# Patient Record
Sex: Male | Born: 1990 | Race: White | Hispanic: No | Marital: Single | Smoking: Never smoker
Health system: Southern US, Community
[De-identification: ages and names within clinical notes are randomized; demographics above are authoritative.]

## PROBLEM LIST (undated history)

## (undated) DIAGNOSIS — F329 Major depressive disorder, single episode, unspecified: Secondary | ICD-10-CM

## (undated) DIAGNOSIS — F419 Anxiety disorder, unspecified: Secondary | ICD-10-CM

## (undated) DIAGNOSIS — F32A Depression, unspecified: Secondary | ICD-10-CM

---
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
## 2015-07-26 ENCOUNTER — Emergency Department (HOSPITAL_COMMUNITY)
Admission: EM | Admit: 2015-07-26 | Discharge: 2015-07-26 | Disposition: A | Discharge status: Home / Self Care | Payer: BLUE CROSS/BLUE SHIELD | Attending: Emergency Medicine | Admitting: Emergency Medicine

## 2015-07-26 ENCOUNTER — Encounter (HOSPITAL_COMMUNITY): Payer: Self-pay

## 2015-07-26 DIAGNOSIS — F329 Major depressive disorder, single episode, unspecified: Secondary | ICD-10-CM | POA: Diagnosis not present

## 2015-07-26 DIAGNOSIS — R45851 Suicidal ideations: Secondary | ICD-10-CM

## 2015-07-26 DIAGNOSIS — F32A Depression, unspecified: Secondary | ICD-10-CM

## 2015-07-26 HISTORY — DX: Depression, unspecified: F32.A

## 2015-07-26 HISTORY — DX: Anxiety disorder, unspecified: F41.9

## 2015-07-26 HISTORY — DX: Major depressive disorder, single episode, unspecified: F32.9

## 2015-07-26 LAB — CBC WITH DIFFERENTIAL/PLATELET
Basophils Absolute: 0.1 10*3/uL (ref 0.0–0.1)
Basophils Relative: 2 %
Eosinophils Absolute: 0.1 10*3/uL (ref 0.0–0.7)
Eosinophils Relative: 2 %
HCT: 46.6 % (ref 39.0–52.0)
Hemoglobin: 16.9 g/dL (ref 13.0–17.0)
Lymphocytes Relative: 42 %
Lymphs Abs: 2.4 10*3/uL (ref 0.7–4.0)
MCH: 31.8 pg (ref 26.0–34.0)
MCHC: 36.3 g/dL — ABNORMAL HIGH (ref 30.0–36.0)
MCV: 87.6 fL (ref 78.0–100.0)
Monocytes Absolute: 0.6 10*3/uL (ref 0.1–1.0)
Monocytes Relative: 10 %
Neutro Abs: 2.5 10*3/uL (ref 1.7–7.7)
Neutrophils Relative %: 44 %
Platelets: 228 10*3/uL (ref 150–400)
RBC: 5.32 MIL/uL (ref 4.22–5.81)
RDW: 12.7 % (ref 11.5–15.5)
WBC: 5.7 10*3/uL (ref 4.0–10.5)

## 2015-07-26 LAB — BASIC METABOLIC PANEL
Anion gap: 6 (ref 5–15)
BUN: 21 mg/dL — ABNORMAL HIGH (ref 6–20)
CO2: 30 mmol/L (ref 22–32)
Calcium: 9.4 mg/dL (ref 8.9–10.3)
Chloride: 103 mmol/L (ref 101–111)
Creatinine, Ser: 1.21 mg/dL (ref 0.61–1.24)
GFR calc Af Amer: >60 mL/min (ref >60)
GFR calc non Af Amer: >60 mL/min (ref >60)
Glucose, Bld: 96 mg/dL (ref 65–99)
Potassium: 4.2 mmol/L (ref 3.5–5.1)
Sodium: 139 mmol/L (ref 135–145)

## 2015-07-26 LAB — RAPID URINE DRUG SCREEN, HOSP PERFORMED
Amphetamines: NOT DETECTED
Barbiturates: NOT DETECTED
Benzodiazepines: NOT DETECTED
Cocaine: NOT DETECTED
Opiates: NOT DETECTED
Tetrahydrocannabinol: NOT DETECTED

## 2015-07-26 LAB — ETHANOL: Alcohol, Ethyl (B): <5 mg/dL (ref <5)

## 2015-07-26 MED ORDER — ACETAMINOPHEN 325 MG PO TABS: 650.0000 mg | ORAL_TABLET | ORAL | Status: DC | PRN

## 2015-07-26 MED ORDER — ALUM & MAG HYDROXIDE-SIMETH 200-200-20 MG/5ML PO SUSP: 30.0000 mL | ORAL | Status: DC | PRN

## 2015-07-26 MED ORDER — ONDANSETRON HCL 4 MG PO TABS: 4.0000 mg | ORAL_TABLET | Freq: Three times a day (TID) | ORAL | Status: DC | PRN

## 2015-07-26 MED ORDER — IBUPROFEN 200 MG PO TABS
600.0000 mg | ORAL_TABLET | Freq: Three times a day (TID) | ORAL | Status: DC | PRN
Start: 2015-07-26 — End: 2015-07-27

## 2015-07-26 MED ORDER — ZOLPIDEM TARTRATE 5 MG PO TABS: 5.0000 mg | ORAL_TABLET | Freq: Every evening | ORAL | Status: DC | PRN

## 2015-07-26 NOTE — ED Notes (Signed)
States he has some "triggers" going on.  Denies SI/HI. o physical complaints and no home medications. Support offered and POC explained.

## 2015-07-26 NOTE — ED Notes (Signed)
Pt states feeling depressed.  Told by nurse at work that he may need to come in to be evaluated.  Pt has had depression for about a week.  Denies SI/HI but did have thoughts of hurting self on Sunday.  Does not take meds.  Attempting to get back with counselor but cannot get appt as of yet.  Possible late June.

## 2015-07-26 NOTE — ED Notes (Signed)
Pt passed out mid blood draw.  Cool rag applied to head and RN notified for assistance.

## 2015-07-26 NOTE — ED Notes (Signed)
Patient denies SI, HI, AVH. Denies pain. No acute signs of distress noted. Contracts for safety. Patient sister Berline Choughmily Bozich aware that patient is discharging.

## 2015-07-26 NOTE — ED Notes (Signed)
Bed: [facility] Expected date:  Expected time:  Means of arrival:  Comments: Hold for triage 3 

## 2015-07-26 NOTE — ED Provider Notes (Signed)
This is a 25 year old male who presented to the emergency department for his depression.  I was asked to come and evaluate the patient after his second evaluation by TTS. Patient was denying any suicidal or homicidal ideation. He did report increased depression and states over the weekend he had some vague thoughts of suicide without any plan or intention. 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 He does not feel that he needs to stay overnight for inpatient help at this time. His parents are away although he lives at home with them. They've the TTS evaluator had felt that the patient could go home after his second evaluation of the patient. [facility] and says that he can make arrangements to follow-up there. Patient was given strict return precautions. The nurse discuss with the patient's sister that he would need to stay with somebody for safety this evening. Patient to stay with his sister overnight. Patient contracted for safety with TTS and also signed a contract for safety before leaving the department. Patient with no history of previous suicide attempts. He was discharged in stable condition with strict return precautions.  Leta BaptistEmily Roe Aries Townley, MD 07/27/15 [phone]

## 2015-07-26 NOTE — ED Notes (Signed)
James Morrow [phone]  [name] has stated to Clinical research associatewriter that patient can stay with her.

## 2015-07-26 NOTE — Discharge Instructions (Signed)
Suicidal Feelings: How to Help Yourself  Suicide is the taking of one's own life. If you feel as though life is getting too tough to handle and are thinking about suicide, get help right away. To get help:  · Call your local emergency services (911 in the [place]  · Call a suicide hotline to speak with a trained counselor who understands how you are feeling. The following is a list of suicide hotlines in the [place]. For a list of hotlines in [place], visit www.suicide.org/hotlines/international/[place]-suicide-hotlines.html.  ·  1-800-273-TALK (1-800-273-8255).  ·  1-800-SUICIDE (1-800-784-2433).  ·  1-888-628-9454. This is a hotline for Spanish speakers.  ·  1-800-799-4TTY (1-800-799-4889). This is a hotline for TTY users.  ·  1-866-4-U-TREVOR (1-866-488-7386). This is a hotline for lesbian, gay, bisexual, transgender, or questioning youth.  · Contact a crisis center or a local suicide prevention center. To find a crisis center or suicide prevention center:  · Call your local hospital, clinic, community service organization, mental health center, social service provider, or health department. Ask for assistance in connecting to a crisis center.  · Visit www.suicidepreventionlifeline.org/getinvolved/locator for a list of crisis centers in the [place], or visit www.suicideprevention.[place]/thinking-about-suicide/find-a-crisis-centre for a list of centers in [place].  · Visit the following websites:  ·  National Suicide Prevention Lifeline: www.suicidepreventionlifeline.org  ·  Hopeline: www.hopeline.com  ·  American Foundation for Suicide Prevention: www.afsp.org  ·  The Trevor Project (for lesbian, gay, bisexual, transgender, or questioning youth): www.thetrevorproject.org  HOW CAN I HELP MYSELF FEEL BETTER?  · Promise yourself that you will not do anything drastic when you have suicidal feelings. Remember, there is hope. Many people have gotten through suicidal thoughts and feelings, and you will, too. You may  have gotten through them before, and this proves that you can get through them again.  · Let family, friends, teachers, or counselors know how you are feeling. Try not to isolate yourself from those who care about you. Remember, they will want to help you. Talk with someone every day, even if you do not feel sociable. Face-to-face conversation is best.  · Call a mental health professional and see one regularly.  · Visit your primary health care provider every year.  · Eat a well-balanced diet, and space your meals so you eat regularly.  · Get plenty of rest.  · Avoid alcohol and drugs, and remove them from your home. They will only make you feel worse.  · If you are thinking of taking a lot of medicine, give your medicine to someone who can give it to you one day at a time. If you are on antidepressants and are concerned you will overdose, let your health care provider know so he or she can give you safer medicines. Ask your mental health professional about the possible side effects of any medicines you are taking.  · Remove weapons, poisons, knives, and anything else that could harm you from your home.  · Try to stick to routines. Follow a schedule every day. Put self-care on your schedule.  · Make a list of realistic goals, and cross them off when you achieve them. Accomplishments give a sense of worth.  · Wait until you are feeling better before doing the things you find difficult or unpleasant.  · Exercise if you are able. You will feel better if you exercise for even a half hour each day.  · Go out in the sun or into nature. This will help you recover from depression faster. If you have a favorite place to   walk, go there.  · Do the things that have always given you pleasure. Play your favorite music, read a good book, paint a picture, play your favorite instrument, or do anything else that takes your mind off your depression if it is safe to do.  · Keep your living space well lit.  · When you are feeling well,  write yourself a letter about tips and support that you can read when you are not feeling well.  · Remember that life's difficulties can be sorted out with help. Conditions can be treated. You can work on thoughts and strategies that serve you well.     This information is not intended to replace advice given to you by your health care provider. Make sure you discuss any questions you have with your health care provider.     Document Released: 08/19/2002 Document Revised: 03/05/2014 Document Reviewed: 06/09/2013  Elsevier Interactive Patient Education ©2016 Else[employer].  Major Depressive Disorder  Major depressive disorder is a mental illness. It also may be called clinical depression or unipolar depression. Major depressive disorder usually causes feelings of sadness, hopelessness, or helplessness. Some people with this disorder do not feel particularly sad but lose interest in doing things they used to enjoy (anhedonia). Major depressive disorder also can cause physical symptoms. It can interfere with work, school, relationships, and other normal everyday activities. The disorder varies in severity but is longer lasting and more serious than the sadness we all feel from time to time in our lives.  Major depressive disorder often is triggered by stressful life events or major life changes. Examples of these triggers include divorce, loss of your job or home, a move, and the death of a family member or close friend. Sometimes this disorder occurs for no obvious reason at all. People who have family members with major depressive disorder or bipolar disorder are at higher risk for developing this disorder, with or without life stressors. Major depressive disorder can occur at any age. It may occur just once in your life (single episode major depressive disorder). It may occur multiple times (recurrent major depressive disorder).  SYMPTOMS  People with major depressive disorder have either anhedonia or depressed mood  on nearly a daily basis for at least 2 weeks or longer. Symptoms of depressed mood include:  · Feelings of sadness (blue or down in the dumps) or emptiness.  · Feelings of hopelessness or helplessness.  · Tearfulness or episodes of crying (may be observed by others).  · Irritability (children and adolescents).  In addition to depressed mood or anhedonia or both, people with this disorder have at least four of the following symptoms:  · Difficulty sleeping or sleeping too much.    · Significant change (increase or decrease) in appetite or weight.    · Lack of energy or motivation.  · Feelings of guilt and worthlessness.    · Difficulty concentrating, remembering, or making decisions.  · Unusually slow movement (psychomotor retardation) or restlessness (as observed by others).    · Recurrent wishes for death, recurrent thoughts of self-harm (suicide), or a suicide attempt.  People with major depressive disorder commonly have persistent negative thoughts about themselves, other people, and the world. People with severe major depressive disorder may experience distorted beliefs or perceptions about the world (psychotic delusions). They also may see or hear things that are not real (psychotic hallucinations).  DIAGNOSIS  Major depressive disorder is diagnosed through an assessment by your health care provider. Your health care provider will ask about aspects of   your daily life, such as mood, sleep, and appetite, to see if you have the diagnostic symptoms of major depressive disorder. Your health care provider may ask about your medical history and use of alcohol or drugs, including prescription medicines. Your health care provider also may do a physical exam and blood work. This is because certain medical conditions and the use of certain substances can cause major depressive disorder-like symptoms (secondary depression). Your health care provider also may refer you to a mental health specialist for further evaluation  and treatment.  TREATMENT  It is important to recognize the symptoms of major depressive disorder and seek treatment. The following treatments can be prescribed for this disorder:    · Medicine. Antidepressant medicines usually are prescribed. Antidepressant medicines are thought to correct chemical imbalances in the brain that are commonly associated with major depressive disorder. Other types of medicine may be added if the symptoms do not respond to antidepressant medicines alone or if psychotic delusions or hallucinations occur.  · Talk therapy. Talk therapy can be helpful in treating major depressive disorder by providing support, education, and guidance. Certain types of talk therapy also can help with negative thinking (cognitive behavioral therapy) and with relationship issues that trigger this disorder (interpersonal therapy).  A mental health specialist can help determine which treatment is best for you. Most people with major depressive disorder do well with a combination of medicine and talk therapy. Treatments involving electrical stimulation of the brain can be used in situations with extremely severe symptoms or when medicine and talk therapy do not work over time. These treatments include electroconvulsive therapy, transcranial magnetic stimulation, and vagal nerve stimulation.     This information is not intended to replace advice given to you by your health care provider. Make sure you discuss any questions you have with your health care provider.     Document Released: 06/09/2012 Document Revised: 03/05/2014 Document Reviewed: 06/09/2012  Elsevier Interactive Patient Education ©2016 Elsevier Inc.

## 2015-07-26 NOTE — ED Provider Notes (Signed)
CSN: [id number]     Arrival date & time 07/26/15  1413 History   First MD Initiated Contact with Patient 07/26/15 1444     Chief Complaint  Patient presents with  . Depression     (Consider location/radiation/quality/duration/timing/severity/associated sxs/prior Treatment) HPI Patient presents to the emergency department withWorsening depression over the last several weeks.  He is also been having episodes suicidal ideation.  The patient states he sat in his house all weekend crying and depressed and was contemplating suicide.  The patient states that he has never attempted suicide in the past.  He feels like his depression is getting worse.  He has had some recent issues that have made this exacerbated. The patient denies chest pain, shortness of breath, headache,blurred vision, neck pain, fever, cough, weakness, numbness, dizziness, anorexia, edema, abdominal pain, nausea, vomiting, diarrhea, rash, back pain, dysuria, hematemesis, bloody stool, near syncope, or syncope. Past Medical History  Diagnosis Date  . Depression   . Anxiety disorder    History reviewed. No pertinent past surgical history. History reviewed. No pertinent family history. Social History  Substance Use Topics  . Smoking status: Never Smoker   . Smokeless tobacco: None  . Alcohol Use: Yes     Comment: social    Review of Systems   All other systems negative except as documented in the HPI. All pertinent positives and negatives as reviewed in the HPI. Allergies  Review of patient's allergies indicates no known allergies.  Home Medications   Prior to Admission medications   Not on File   BP 126/77 mmHg  Pulse 58  Temp(Src) 97.7 F (36.5 C) (Oral)  Resp 16  SpO2 100% Physical Exam  Constitutional: He is oriented to person, place, and time. He appears well-developed and well-nourished. No distress.  HENT:  Head: Normocephalic and atraumatic.  Mouth/Throat: Oropharynx is clear and moist.  Eyes: Pupils  are equal, round, and reactive to light.  Neck: Normal range of motion. Neck supple.  Cardiovascular: Normal rate, regular rhythm and normal heart sounds.  Exam reveals no gallop and no friction rub.   No murmur heard. Pulmonary/Chest: Effort normal and breath sounds normal. No respiratory distress. He has no wheezes.  Abdominal: Soft. Bowel sounds are normal. He exhibits no distension. There is no tenderness.  Neurological: He is alert and oriented to person, place, and time. He exhibits normal muscle tone. Coordination normal.  Skin: Skin is warm and dry. No rash noted. No erythema.  Psychiatric: His speech is normal and behavior is normal. Thought content is not paranoid. He exhibits a depressed mood. He expresses suicidal ideation. He expresses no homicidal ideation. He expresses no suicidal plans and no homicidal plans.  Nursing note and vitals reviewed.   ED Course  Procedures (including critical care time) Labs Review Labs Reviewed  BASIC METABOLIC PANEL - Abnormal; Notable for the following:    BUN 21 (*)    All other components within normal limits  CBC WITH DIFFERENTIAL/PLATELET - Abnormal; Notable for the following:    MCHC 36.3 (*)    All other components within normal limits  URINE RAPID DRUG SCREEN, HOSP PERFORMED  ETHANOL    Imaging Review No results found. I have personally reviewed and evaluated these images and lab results as part of my medical decision-making.   EKG Interpretation None      MDM   Final diagnoses:  None    Patient will need TTS assessment for suicidal ideation and increasing depression  Charlestine Nighthristopher Anthon Harpole, PA-C  07/26/15 2128  Arby Barrette, MD 08/04/15 [phone]

## 2015-07-26 NOTE — ED Notes (Signed)
Patient reports that he has not been to [facility] in approximately one year. States that he did call and leave a message with them today. States he is willing to get outpatient help.   Patient has signed ROI form to speak with his sister Berline Choughmily Halls. [facility]

## 2015-07-26 NOTE — BH Assessment (Signed)
[facility] Assessment Progress Note This Clinical research associatewriter spoke to patient after admission with patient stating he feels he is ready to be discharged. Patient states since he has been hydrated he is feeling, "much better." Patient presents brighter in his affect and states he will follow through with [facility] in the a.m. to investigate treatment options to include medication management. Patient denies any S/I or H/I and can contract for safety. Case was staffed with Shaune PollackLord DNP who agreed patient could be discharged.

## 2015-07-26 NOTE — ED Notes (Signed)
Patient appears mildly anxious, cooperative. Denies SI, HI, AVH.   Patient verbally contracts for safety at home.   Encouragement offered. Patient denies complaint.  Q15 safety checks continue.

## 2015-07-26 NOTE — ED Notes (Signed)
David with TTS states he has reassessed patient and has spoken with psych PA, and they agree patient can go home. Onalee HuaDavid reports he will speak with EDP regarding discharge.

## 2015-07-26 NOTE — BH Assessment (Addendum)
Assessment Note  James Morrow N Schank is an 25 y.o. male that presents this date after having feelings that were very "overwhelming" on Saturday 07/23/15. Patient reports that he resides with his parents (who [place] in [place] and states he had some passive S/I over the weekend after what he explained as having an anxiety attack. Patient stated he started feeling "tense" on Saturday 07/23/15 and felt he "could not move" reporting ringing in his ears and felt dizzy. Patient stated he was so overwhelmed that he had some thoughts of S/I and then reported he felt very upset that he had those feelings. Patient denied any plan. Patient denies any S/I or prior inpatient admissions but did report some outpatient therapy in 2015. Patient stated that in 2015 he reported increased anxiety with depression and starting seeing a provider at [facility] (May MD) who prescribed Zoloft (patient unsure of dose) but reported that he discontinued the medication after six months. Patient stated he felt the medications were not working and felt he "worked through" his depression. Patient also reported seeing a [facility] for two sessions also but felt the medication was assisting him with his issues and discontinued his therapy. Patient denies any other outpatient treatment but did state during the assessment that he will go back to [facility] this week to investigate treatment options for his ongoing anxiety/depression. Patient could not identify any stressors and felt some of his issues "may be medical." Patient did feel faint during a blood draw prior to this writer assessing patient without incident although patient stated "he started hearing ringing in his ears again and feeling anxious." Patient presented with a very flat affect and was slow to interact with this Clinical research associatewriter. Patient denied any S/I or H/I and reported decreased sleep since 07/23/15. Patient could contract for safety but was open to being observed over night  since patient felt dizzy prior to the assessment. Patient felt he didn't require an inpatient admission but is open to medication interventions. Case was staffed with Shaune PollackLord DNP who recommended patient be observed over night and re-evaluated in the a.m.          Diagnosis: GAD  Past Medical History:  Past Medical History  Diagnosis Date  . Depression   . Anxiety disorder     History reviewed. No pertinent past surgical history.  Family History: History reviewed. No pertinent family history.  Social History:  reports that he has never smoked. He does not have any smokeless tobacco history on file. He reports that he drinks alcohol. He reports that he does not use illicit drugs.  Additional Social History:  Alcohol / Drug Use Pain Medications: See MAR Prescriptions: See MAR Over the Counter: See MAR History of alcohol / drug use?: No history of alcohol / drug abuse  CIWA: CIWA-Ar BP: 141/92 mmHg Pulse Rate: 72 COWS:    Allergies: No Known Allergies  Home Medications:  (Not in a hospital admission)  OB/GYN Status:  No LMP for male patient.  General Assessment Data Location of Assessment: [facility] TTS Assessment: In system Is this a Tele or Face-to-Face Assessment?: Face-to-Face Is this an Initial Assessment or a Re-assessment for this encounter?: Initial Assessment Marital status: Single Maiden name: na Is patient pregnant?: No Pregnancy Status: No Living Arrangements: Parent Can pt return to current living arrangement?: Yes Admission Status: Voluntary Is patient capable of signing voluntary admission?: Yes Referral Source: Self/Family/Friend Insurance type: BC/BS  Medical Screening Exam [facility] ONLY) Medical Exam completed: Yes  Crisis Care Plan Living Arrangements: Parent Legal Guardian: Other: (none) Name of Psychiatrist: na Name of Therapist: none currently  Education Status Is patient currently in school?: Yes Current Grade: na Highest grade of school  patient has completed: 12 Name of school: na Contact person: na  Risk to self with the past 6 months Suicidal Ideation: No Has patient been a risk to self within the past 6 months prior to admission? : No Suicidal Intent: No Has patient had any suicidal intent within the past 6 months prior to admission? : No Is patient at risk for suicide?: No Suicidal Plan?: No Has patient had any suicidal plan within the past 6 months prior to admission? : No Access to Means: No What has been your use of drugs/alcohol within the last 12 months?: Denies Previous Attempts/Gestures: No How many times?: 0 Other Self Harm Risks: None Triggers for Past Attempts: Other (Comment) (None noted) Intentional Self Injurious Behavior: None Family Suicide History: No Recent stressful life event(s): Other (Comment) (pt denies any symptoms) Persecutory voices/beliefs?: No Depression: Yes (In the past pt had some depression in 2015 not current) Depression Symptoms:  (None noted) Substance abuse history and/or treatment for substance abuse?: No Suicide prevention information given to non-admitted patients: Not applicable  Risk to Others within the past 6 months Homicidal Ideation: No Does patient have any lifetime risk of violence toward others beyond the six months prior to admission? : No Thoughts of Harm to Others: No Current Homicidal Intent: No Current Homicidal Plan: No Access to Homicidal Means: No Identified Victim: na History of harm to others?: No Assessment of Violence: None Noted Violent Behavior Description: none noted Does patient have access to weapons?: No Criminal Charges Pending?: No Does patient have a court date: No Is patient on probation?: No  Psychosis Hallucinations: None noted Delusions: None noted  Mental Status Report Appearance/Hygiene: Unremarkable Eye Contact: Fair Motor Activity: Freedom of movement Speech: Logical/coherent Level of Consciousness: Alert Mood:  Pleasant Affect: Appropriate to circumstance Anxiety Level: Minimal Thought Processes: Coherent, Relevant Judgement: Unimpaired Orientation: Person, Place, Time Obsessive Compulsive Thoughts/Behaviors: None  Cognitive Functioning Concentration: Normal Memory: Recent Intact, Remote Intact IQ: Average Insight: Good Impulse Control: Good Appetite: Good Weight Loss: 0 Weight Gain: 0 Sleep: Decreased Total Hours of Sleep: 5 Vegetative Symptoms: None  ADLScreening [facility] Assessment Services) Patient's cognitive ability adequate to safely complete daily activities?: Yes Patient able to express need for assistance with ADLs?: Yes Independently performs ADLs?: Yes (appropriate for developmental age)  Prior Inpatient Therapy Prior Inpatient Therapy: No Prior Therapy Dates: 2015 Prior Therapy Facilty/Provider(s): [facility] Reason for Treatment: Anxiety  Prior Outpatient Therapy Prior Outpatient Therapy: Yes Prior Therapy Dates: 2015 Prior Therapy Facilty/Provider(s): [facility] Reason for Treatment: Anxiety Does patient have an ACCT team?: No Does patient have Intensive In-House Services?  : No Does patient have [facility] services? : No Does patient have P4CC services?: No  ADL Screening (condition at time of admission) Patient's cognitive ability adequate to safely complete daily activities?: Yes Is the patient deaf or have difficulty hearing?: No Does the patient have difficulty seeing, even when wearing glasses/contacts?: No Does the patient have difficulty concentrating, remembering, or making decisions?: No Patient able to express need for assistance with ADLs?: Yes Does the patient have difficulty dressing or bathing?: No Independently performs ADLs?: Yes (appropriate for developmental age) Does the patient have difficulty walking or climbing stairs?: No Weakness of Legs: None Weakness of Arms/Hands: None  Home Assistive Devices/Equipment Home Assistive  Devices/Equipment: None  Therapy Consults (therapy consults require a physician order) PT Evaluation Needed: No OT Evalulation Needed: No SLP Evaluation Needed: No Abuse/Neglect Assessment (Assessment to be complete while patient is alone) Physical Abuse: Denies Verbal Abuse: Denies Sexual Abuse: Denies Exploitation of patient/patient's resources: Denies Self-Neglect: Denies Values / Beliefs Cultural Requests During Hospitalization: None Spiritual Requests During Hospitalization: None Consults Spiritual Care Consult Needed: No Social Work Consult Needed: No Merchant navy officer (For Healthcare) Does patient have an advance directive?: No Would patient like information on creating an advanced directive?: No - patient declined information (pt declines information)    Additional Information 1:1 In Past 12 Months?: No CIRT Risk: No Elopement Risk: No Does patient have medical clearance?: Yes     Disposition:  Case was staffed with Shaune Pollack DNP who recommended patient be observed over night and re-evaluated in the a.m.          Disposition Initial Assessment Completed for this Encounter: Yes Disposition of Patient: Referred to ([facility]) Patient referred to: Other (Comment) ([facility])  On Site Evaluation by:   Reviewed with Physician:    Alfredia Ferguson 07/26/2015 5:43 PM

## 2015-12-14 ENCOUNTER — Other Ambulatory Visit (INDEPENDENT_AMBULATORY_CARE_PROVIDER_SITE_OTHER): Payer: Self-pay | Admitting: Otolaryngology

## 2015-12-14 DIAGNOSIS — H9319 Tinnitus, unspecified ear: Secondary | ICD-10-CM

## 2015-12-14 DIAGNOSIS — H918X9 Other specified hearing loss, unspecified ear: Secondary | ICD-10-CM

## 2015-12-16 ENCOUNTER — Ambulatory Visit
Admission: RE | Admit: 2015-12-16 | Discharge: 2015-12-16 | Disposition: A | Discharge status: Home / Self Care | Payer: BLUE CROSS/BLUE SHIELD | Source: Ambulatory Visit | Attending: Otolaryngology | Admitting: Otolaryngology

## 2015-12-16 DIAGNOSIS — H918X9 Other specified hearing loss, unspecified ear: Secondary | ICD-10-CM

## 2015-12-16 DIAGNOSIS — H9319 Tinnitus, unspecified ear: Secondary | ICD-10-CM

## 2015-12-16 MED ORDER — GADOBENATE DIMEGLUMINE 529 MG/ML IV SOLN
13.0000 mL | Freq: Once | INTRAVENOUS | Status: AC | PRN
  Administered 2015-12-16: 13 mL via INTRAVENOUS

## 2017-05-24 ENCOUNTER — Ambulatory Visit
Admission: RE | Admit: 2017-05-24 | Discharge: 2017-05-24 | Disposition: A | Discharge status: Home / Self Care | Payer: BLUE CROSS/BLUE SHIELD | Source: Ambulatory Visit | Attending: Family Medicine | Admitting: Family Medicine

## 2017-05-24 ENCOUNTER — Other Ambulatory Visit: Payer: Self-pay | Admitting: Family Medicine

## 2017-05-24 DIAGNOSIS — M79671 Pain in right foot: Secondary | ICD-10-CM

## 2017-12-10 ENCOUNTER — Ambulatory Visit (INDEPENDENT_AMBULATORY_CARE_PROVIDER_SITE_OTHER): Payer: BLUE CROSS/BLUE SHIELD | Admitting: Psychiatry

## 2017-12-10 DIAGNOSIS — F411 Generalized anxiety disorder: Secondary | ICD-10-CM

## 2017-12-10 DIAGNOSIS — F4010 Social phobia, unspecified: Secondary | ICD-10-CM

## 2017-12-10 DIAGNOSIS — F332 Major depressive disorder, recurrent severe without psychotic features: Secondary | ICD-10-CM | POA: Insufficient documentation

## 2017-12-10 MED ORDER — ARIPIPRAZOLE 5 MG PO TABS: 5.0000 mg | ORAL_TABLET | Freq: Every day | ORAL | 1 refills | Status: DC

## 2017-12-10 MED ORDER — SERTRALINE HCL 100 MG PO TABS: 150.0000 mg | ORAL_TABLET | Freq: Every day | ORAL | 1 refills | Status: DC

## 2017-12-10 NOTE — Progress Notes (Signed)
[facility] Med Check  Patient ID: James Morrow,  MRN: [id number]  PCP: Ozella Rocks, MD  Date of Evaluation: 12/10/2017 Time spent:30 minutes   HISTORY/CURRENT STATUS: HPI CC dep with SI.  Dep x 2 mos severe and less severe longer.  SI  For a week.  Voiced at work.  Thought s cutting wrists.  Work may have made it worse.  Missed 2 days work/week lately. Poor performance.  No extreme stress. He wants help and not to die.  Commits to safety. Zoloft helped anxiety several years ago. Not much benefit this time.  Sleep 6-7 hours ok.  Normal. He provided written statement of his sx.  Prior psych meds: Wellbutrin NR, Buspirone NR.  Mirtazepine for sleep. Individual Medical History/ Review of Systems: Changes? :No  Allergies: Patient has no known allergies.  Current Medications:  Current Outpatient Medications:  .  sertraline (ZOLOFT) 100 MG tablet, Take 100 mg by mouth daily., Disp: , Rfl:   Started in July.  Medication Side Effects: None  Family Medical/ Social History: Changes? Yes FHx dep and anxiety in both parents.  Both on psych meds. Alcohol a beer/D.  No drugs. Not pot.  MENTAL HEALTH EXAM:  There were no vitals taken for this visit.There is no height or weight on file to calculate BMI.  General Appearance: Casual  Eye Contact:  Fair  Speech:  reduced quantity.  Chronic difficulty expressing himself verbally.  Volume:  Decreased  Mood:  Anxious, Depressed and Hopeless  Affect:  Constricted and Depressed  Thought Process:  Goal Directed  Orientation:  Full (Time, Place, and Person)  Thought Content: Logical   Suicidal Thoughts:  Yes.  without intent/plan  Homicidal Thoughts:  No  Memory:  Recent  Judgement:  Fair  Insight:  Fair  Psychomotor Activity:  Normal  Concentration:  Concentration: Good  Recall:  Good  Fund of Knowledge: Good  Language: Good  Akathisia:  No  AIMS (if indicated): not done  Assets:  Housing Physical  Health Transportation Vocational/Educational  ADL's:  Intact  Cognition: WNL  Prognosis:  Fair    DIAGNOSES:    ICD-10-CM   1. Severe episode of recurrent major depressive disorder, without psychotic features (HCC) F33.2   2. Generalized anxiety disorder F41.1   3. Social anxiety disorder F40.10     RECOMMENDATIONS:  Greater than 50% of face to face time with patient was spent on counseling and coordination of care. We discussed the severity of his depression with recent suicidal thoughts of cutting him self "vertically" with the goal of causing death.  He now states he no longer wants to die but wants help.  He does not know why his depression has worsened so severely has been there to a mild to moderate degree for some time but is escalated out of proportion to stressors. Discussed safety issues and he commits to safety. We discussed various treatment options.  He has had no side effects and a history of partial benefit from Zoloft so we will increase the dose to 150.  Because of the severity of symptoms and the need for escalating the recovery rate we will also add aripiprazole 5 mg daily.  We discussed the mechanism of action of this medicine in response to his questions about it.  This may possibly be a temporary augmentation strategy.  Discussed side effects including EPS type side effects.  We discussed the importance of attacking the depression as aggressively and as possible which includes psychotherapy.  He  is planning to start with a therapist rec from a friend who does CBT.  He commmits ot safety.  Call if gets worse.  He agrees to the plan and will call if he has any worsening symptoms. Lauraine Rinne, MD

## 2017-12-10 NOTE — Patient Instructions (Signed)
Start counseling and the meds ordered. Call if anything gets worse.

## 2018-01-10 ENCOUNTER — Ambulatory Visit (INDEPENDENT_AMBULATORY_CARE_PROVIDER_SITE_OTHER): Payer: BLUE CROSS/BLUE SHIELD | Admitting: Psychiatry

## 2018-01-10 ENCOUNTER — Encounter: Payer: Self-pay | Admitting: Psychiatry

## 2018-01-10 DIAGNOSIS — F322 Major depressive disorder, single episode, severe without psychotic features: Secondary | ICD-10-CM | POA: Diagnosis not present

## 2018-01-10 DIAGNOSIS — F4010 Social phobia, unspecified: Secondary | ICD-10-CM | POA: Diagnosis not present

## 2018-01-10 DIAGNOSIS — F411 Generalized anxiety disorder: Secondary | ICD-10-CM

## 2018-01-10 NOTE — Progress Notes (Signed)
James Morrow [id number] 01-27-1991 27 y.o.  Subjective:   Patient ID:  James Morrow is a 27 y.o. (DOB 1990/12/09) male.  Chief Complaint:  Chief Complaint  Patient presents with  . Depression  . Anxiety    HPI [name] presents to the office today for follow-up of severe depression and some chronic anxiety.  At last visit on 10/15 we increased the sertraline 10 150mg  and added aripiprazole at 5 mg daily.  The first week noticed improvement with the Abilify but lost the benefit quickly.  Back to where it was before.  Seems like tiredness and sleepiness is a SE starting 2 weeks or 10 days into treatment.  Pt reports that mood is Severe 10/10 Depressed, Hopeless and Worthless and describes anxiety as Moderate. Anxiety symptoms include: Excessive Worry,. Pt reports no sleep issues; 7 hours normal. Pt reports that appetite is good. Pt reports that energy is poor and anhedonia, loss of interest or pleasure in usual activities and poor motivation. Concentration is poor. Suicidal thoughts:  patient admits to thoughts but denies active plan or intent, agrees to no suicide contract and some intrusive images of suicide.  Goes to work but doesn't do much and leaves early.  Hard to function.  Can't function more than 1-2 hours.  Nurse at work, GP, and boss recommend partial disability of 4 hours daily until he recovers.  Review of Systems:  Review of Systems  Constitutional: Positive for fatigue.  Gastrointestinal: Positive for nausea.  Neurological: Negative for tremors and weakness.  Psychiatric/Behavioral: Positive for decreased concentration, dysphoric mood and suicidal ideas. Negative for agitation, behavioral problems, confusion, hallucinations, self-injury and sleep disturbance. The patient is nervous/anxious. The patient is not hyperactive.   Nausea predates med changes  Medications: I have reviewed the patient's current medications.  Current Outpatient Medications  Medication Sig  Dispense Refill  . ARIPiprazole (ABILIFY) 5 MG tablet Take 1 tablet (5 mg total) by mouth daily. 30 tablet 1  . sertraline (ZOLOFT) 100 MG tablet Take 1.5 tablets (150 mg total) by mouth daily. 45 tablet 1   No current facility-administered medications for this visit.     Medication Side Effects: Fatigue  Allergies: No Known Allergies  Past Medical History:  Diagnosis Date  . Anxiety disorder   . Depression     History reviewed. No pertinent family history.  Social History   Socioeconomic History  . Marital status: Single    Spouse name: Not on file  . Number of children: Not on file  . Years of education: Not on file  . Highest education level: Not on file  Occupational History  . Not on file  Social Needs  . Financial resource strain: Not on file  . Food insecurity:    Worry: Not on file    Inability: Not on file  . Transportation needs:    Medical: Not on file    Non-medical: Not on file  Tobacco Use  . Smoking status: Never Smoker  Substance and Sexual Activity  . Alcohol use: Yes    Comment: social  . Drug use: No  . Sexual activity: Not on file  Lifestyle  . Physical activity:    Days per week: Not on file    Minutes per session: Not on file  . Stress: Not on file  Relationships  . Social connections:    Talks on phone: Not on file    Gets together: Not on file    Attends religious service: Not  on file    Active member of club or organization: Not on file    Attends meetings of clubs or organizations: Not on file    Relationship status: Not on file  . Intimate partner violence:    Fear of current or ex partner: Not on file    Emotionally abused: Not on file    Physically abused: Not on file    Forced sexual activity: Not on file  Other Topics Concern  . Not on file  Social History Narrative  . Not on file    Past Medical History, Surgical history, Social history, and Family history were reviewed and updated as appropriate.   Please see  review of systems for further details on the patient's review from today.   Objective:   Physical Exam:  There were no vitals taken for this visit.  Physical Exam  Neurological: He displays no tremor. Gait normal.  Psychiatric: Judgment normal. His mood appears anxious. His affect is blunt. His speech is tangential. His speech is not rapid and/or pressured, not delayed and not slurred. He is slowed and withdrawn. He is not actively hallucinating. Thought content is not paranoid and not delusional. Cognition and memory are normal. He exhibits a depressed mood. He expresses suicidal ideation. He expresses no homicidal ideation. He expresses no suicidal plans.  Chronically blunted and hypoverbal Insight and judgment fair. He is attentive.    Lab Review:     Component Value Date/Time   NA 139 07/26/2015 1659   K 4.2 07/26/2015 1659   CL 103 07/26/2015 1659   CO2 30 07/26/2015 1659   GLUCOSE 96 07/26/2015 1659   BUN 21 (H) 07/26/2015 1659   CREATININE 1.21 07/26/2015 1659   CALCIUM 9.4 07/26/2015 1659   GFRNONAA >60 07/26/2015 1659   GFRAA >60 07/26/2015 1659       Component Value Date/Time   WBC 5.7 07/26/2015 1659   RBC 5.32 07/26/2015 1659   HGB 16.9 07/26/2015 1659   HCT 46.6 07/26/2015 1659   PLT 228 07/26/2015 1659   MCV 87.6 07/26/2015 1659   MCH 31.8 07/26/2015 1659   MCHC 36.3 (H) 07/26/2015 1659   RDW 12.7 07/26/2015 1659   LYMPHSABS 2.4 07/26/2015 1659   MONOABS 0.6 07/26/2015 1659   EOSABS 0.1 07/26/2015 1659   BASOSABS 0.1 07/26/2015 1659    No results found for: POCLITH, LITHIUM   No results found for: PHENYTOIN, PHENOBARB, VALPROATE, CBMZ   .res Assessment: Plan:    Severe major depression without psychotic features (HCC)  Social anxiety disorder  Generalized anxiety disorder  Also Asberger's Syndrome  Greater than 50% of face to face time with patient was spent on counseling and coordination of care. We discussed Explained the long t 1/2 life of  aripiprazole and how that explains the initial benefit then loss of it and emerging SE in detail.  This leads to other options. Rexulti 0.5mg  daily for 7 days then 1mg  daily.  DC Abilify now.  Discussed potential metabolic side effects associated with atypical antipsychotics, as well as potential risk for movement side effects. Advised pt to contact office if movement side effects occur.   Continue sertraline at 150.  Discussed safety plan at length with patient.  Advised patient to contact office with any worsening signs and symptoms.  Instructed patient to go to the [facility] emergency room for evaluation if experiencing any acute safety concerns, to include suicidal intent.  He commits to safety.  Starts counseling Tuesday, Vonna Kotyk.  Agree with partial disability 1/2 days until responds to treatment  This appt was 30 mins.  FU 3 weeks.  Meredith Staggersarey Cottle, MD, DFAPA    Please see After Visit Summary for patient specific instructions.  Future Appointments  Date Time Provider Department Center  01/31/2018  2:00 PM Cottle, Steva Readyarey G Jr., MD [facility] None    No orders of the defined types were placed in this encounter.     -------------------------------

## 2018-01-10 NOTE — Patient Instructions (Signed)
Stop aripiprazole.  Start Rexulti 05.mg daily for 1 week. If not better then increase to 1 mg daily. Call if any Side Effects.

## 2018-01-22 ENCOUNTER — Ambulatory Visit: Payer: BLUE CROSS/BLUE SHIELD | Admitting: Psychiatry

## 2018-01-31 ENCOUNTER — Encounter: Payer: Self-pay | Admitting: Psychiatry

## 2018-01-31 ENCOUNTER — Ambulatory Visit (INDEPENDENT_AMBULATORY_CARE_PROVIDER_SITE_OTHER): Payer: BLUE CROSS/BLUE SHIELD | Admitting: Psychiatry

## 2018-01-31 DIAGNOSIS — F322 Major depressive disorder, single episode, severe without psychotic features: Secondary | ICD-10-CM | POA: Diagnosis not present

## 2018-01-31 DIAGNOSIS — F332 Major depressive disorder, recurrent severe without psychotic features: Secondary | ICD-10-CM | POA: Diagnosis not present

## 2018-01-31 DIAGNOSIS — F4010 Social phobia, unspecified: Secondary | ICD-10-CM

## 2018-01-31 MED ORDER — SERTRALINE HCL 100 MG PO TABS: 200.0000 mg | ORAL_TABLET | Freq: Every day | ORAL | 1 refills | Status: DC

## 2018-01-31 MED ORDER — BREXPIPRAZOLE 1 MG PO TABS: 1.0000 mg | ORAL_TABLET | Freq: Every day | ORAL | 1 refills | Status: DC

## 2018-01-31 NOTE — Progress Notes (Signed)
[name] [id number] 1990-12-24 27 y.o.  Subjective:   Patient ID:  James Morrow is a 27 y.o. (DOB 1990-12-24) male.  Chief Complaint:  Chief Complaint  Patient presents with  . Follow-up    Med changes, Anxiety and depression.    HPI Lance Boschric N Liss presents to the office today for follow-up of depression.  Generally feels less down.  Rexulti better tolerated with less fatigue.  Pt reports that mood is Anxious and Depressed , depression still moderate to severe but better; and describes anxiety as moderate. Anxiety symptoms include: Excessive Worry,. Pt reports no sleep issues but lays in bed a couple of hours after awakening. Pt reports that appetite is decreased. Pt reports that energy is lethargic and down slightly and withdrawn from usual activities. Concentration is down slightly. Suicidal thoughts:  "once in a blue moon".  Has been working a couple of hours a day.  Conc is not sustainable.  Seen counselor Rene PaciNancy Gutman twice and has pending appt.  Review of Systems:  Review of Systems  Neurological: Negative for tremors and weakness.  Psychiatric/Behavioral: Positive for decreased concentration and dysphoric mood. Negative for agitation, behavioral problems, confusion, hallucinations, self-injury, sleep disturbance and suicidal ideas. The patient is nervous/anxious. The patient is not hyperactive.     Medications: I have reviewed the patient's current medications.  Current Outpatient Medications  Medication Sig Dispense Refill  . Brexpiprazole (REXULTI) 1 MG TABS Take 0.5 mg by mouth daily.    . sertraline (ZOLOFT) 100 MG tablet Take 1.5 tablets (150 mg total) by mouth daily. 45 tablet 1   No current facility-administered medications for this visit.     Medication Side Effects: None  Allergies: No Known Allergies  Past Medical History:  Diagnosis Date  . Anxiety disorder   . Depression     History reviewed. No pertinent family history.  Social History    Socioeconomic History  . Marital status: Single    Spouse name: Not on file  . Number of children: Not on file  . Years of education: Not on file  . Highest education level: Not on file  Occupational History  . Not on file  Social Needs  . Financial resource strain: Not on file  . Food insecurity:    Worry: Not on file    Inability: Not on file  . Transportation needs:    Medical: Not on file    Non-medical: Not on file  Tobacco Use  . Smoking status: Never Smoker  . Smokeless tobacco: Never Used  Substance and Sexual Activity  . Alcohol use: Yes    Comment: social  . Drug use: No  . Sexual activity: Not on file  Lifestyle  . Physical activity:    Days per week: Not on file    Minutes per session: Not on file  . Stress: Not on file  Relationships  . Social connections:    Talks on phone: Not on file    Gets together: Not on file    Attends religious service: Not on file    Active member of club or organization: Not on file    Attends meetings of clubs or organizations: Not on file    Relationship status: Not on file  . Intimate partner violence:    Fear of current or ex partner: Not on file    Emotionally abused: Not on file    Physically abused: Not on file    Forced sexual activity: Not on file  Other  Topics Concern  . Not on file  Social History Narrative  . Not on file    Past Medical History, Surgical history, Social history, and Family history were reviewed and updated as appropriate.   Please see review of systems for further details on the patient's review from today.   Objective:   Physical Exam:  There were no vitals taken for this visit.  Physical Exam  Constitutional: He is oriented to person, place, and time. He appears well-developed. No distress.  Musculoskeletal: He exhibits no deformity.  Neurological: He is alert and oriented to person, place, and time. He displays no tremor. Coordination and gait normal.  Psychiatric: Judgment and  thought content normal. His mood appears anxious. His affect is blunt. His affect is not angry, not labile and not inappropriate. He is slowed. Thought content is not paranoid. Cognition and memory are normal. He exhibits a depressed mood. He expresses no homicidal and no suicidal ideation. He expresses no suicidal plans and no homicidal plans. He is noncommunicative.  Insight and judgment fair.  Noticeable social skills deficits longterm. No auditory or visual hallucinations. No delusions.     Lab Review:     Component Value Date/Time   NA 139 07/26/2015 1659   K 4.2 07/26/2015 1659   CL 103 07/26/2015 1659   CO2 30 07/26/2015 1659   GLUCOSE 96 07/26/2015 1659   BUN 21 (H) 07/26/2015 1659   CREATININE 1.21 07/26/2015 1659   CALCIUM 9.4 07/26/2015 1659   GFRNONAA >60 07/26/2015 1659   GFRAA >60 07/26/2015 1659       Component Value Date/Time   WBC 5.7 07/26/2015 1659   RBC 5.32 07/26/2015 1659   HGB 16.9 07/26/2015 1659   HCT 46.6 07/26/2015 1659   PLT 228 07/26/2015 1659   MCV 87.6 07/26/2015 1659   MCH 31.8 07/26/2015 1659   MCHC 36.3 (H) 07/26/2015 1659   RDW 12.7 07/26/2015 1659   LYMPHSABS 2.4 07/26/2015 1659   MONOABS 0.6 07/26/2015 1659   EOSABS 0.1 07/26/2015 1659   BASOSABS 0.1 07/26/2015 1659    No results found for: POCLITH, LITHIUM   No results found for: PHENYTOIN, PHENOBARB, VALPROATE, CBMZ   .res Assessment: Plan:    Severe major depression without psychotic features (HCC)  Social anxiety disorder   Increase the Rexulti to 1mg  daily and increase the sertraline to 2 of 100mg  tablets.  Discussed side effects in detail.   Discussed potential metabolic side effects associated with atypical antipsychotics, as well as potential risk for movement side effects. Advised pt to contact office if movement side effects occur.   Continue counseling  Disc disability and continue 1/2 time work.  Completed disability paperwork with him.  Discussed that he will  continue partial disability due to impairment in concentration, energy, depressed mood, except  This appt was 30 mins.  Fu 2 weeks  Please see After Visit Summary for patient specific instructions.  No future appointments.  No orders of the defined types were placed in this encounter.     -------------------------------

## 2018-01-31 NOTE — Patient Instructions (Signed)
  Increase the Rexulti to 1mg  daily and increase the sertraline to 2 of 100mg  tablets.

## 2018-02-03 DIAGNOSIS — Z0289 Encounter for other administrative examinations: Secondary | ICD-10-CM

## 2018-02-09 ENCOUNTER — Other Ambulatory Visit: Payer: Self-pay | Admitting: Psychiatry

## 2018-02-09 DIAGNOSIS — F332 Major depressive disorder, recurrent severe without psychotic features: Secondary | ICD-10-CM

## 2018-02-14 ENCOUNTER — Ambulatory Visit (INDEPENDENT_AMBULATORY_CARE_PROVIDER_SITE_OTHER): Payer: BLUE CROSS/BLUE SHIELD | Admitting: Psychiatry

## 2018-02-14 ENCOUNTER — Encounter: Payer: Self-pay | Admitting: Psychiatry

## 2018-02-14 DIAGNOSIS — F322 Major depressive disorder, single episode, severe without psychotic features: Secondary | ICD-10-CM

## 2018-02-14 DIAGNOSIS — F4010 Social phobia, unspecified: Secondary | ICD-10-CM

## 2018-02-14 NOTE — Progress Notes (Signed)
James Morrow [id number] 11-01-1990 27 y.o.  Subjective:   Patient ID:  James Morrow is a 27 y.o. (DOB 11-01-1990) male.  Chief Complaint:  Chief Complaint  Patient presents with  . Depression  . Anxiety  . Stress    HPI Last Seen 01/31/18.  Seen frequently DT severe sx and disability. James Morrow presents to the office today for follow-up of depression.  Generally feels less down.  Rexulti better tolerated with less fatigue than Abilify.  Thinks he's had added beneifit with the increase in sertaline to 200 for 2 weeks and Rexulti for 2 weeks.  Pt reports that mood is Anxious and Depressed , depression reduced to mild- moderate er; and describes anxiety as mild- moderate. Anxiety symptoms include: Excessive Worry,. Pt reports no sleep issues but lays in bed a couple of hours after awakening. Pt reports that appetite is decreased. Hasn't checked weight.  Pt reports that energy is lethargic especially in the am and down slightly and withdrawn from usual activities. Concentration is down slightly. Suicidal thoughts: not lately.  Interest is better.  Can enjoy things.  Has been working a 3-4 hours a day.  Conc is better.  Seen counselor James Morrow twice and has pending appt.  Review of Systems:  Review of Systems  Neurological: Negative for tremors and weakness.  Psychiatric/Behavioral: Positive for decreased concentration and dysphoric mood. Negative for agitation, behavioral problems, confusion, hallucinations, self-injury, sleep disturbance and suicidal ideas. The patient is nervous/anxious. The patient is not hyperactive.     Medications: I have reviewed the patient's current medications.  Current Outpatient Medications  Medication Sig Dispense Refill  . Brexpiprazole (REXULTI) 1 MG TABS Take 1 tablet (1 mg total) by mouth daily. 30 tablet 1  . sertraline (ZOLOFT) 100 MG tablet Take 2 tablets (200 mg total) by mouth daily. 60 tablet 1   No current facility-administered  medications for this visit.     Medication Side Effects: None  Allergies: No Known Allergies  Past Medical History:  Diagnosis Date  . Anxiety disorder   . Depression     History reviewed. No pertinent family history.  Social History   Socioeconomic History  . Marital status: Single    Spouse name: Not on file  . Number of children: Not on file  . Years of education: Not on file  . Highest education level: Not on file  Occupational History  . Not on file  Social Needs  . Financial resource strain: Not on file  . Food insecurity:    Worry: Not on file    Inability: Not on file  . Transportation needs:    Medical: Not on file    Non-medical: Not on file  Tobacco Use  . Smoking status: Never Smoker  . Smokeless tobacco: Never Used  Substance and Sexual Activity  . Alcohol use: Yes    Comment: social  . Drug use: No  . Sexual activity: Not on file  Lifestyle  . Physical activity:    Days per week: Not on file    Minutes per session: Not on file  . Stress: Not on file  Relationships  . Social connections:    Talks on phone: Not on file    Gets together: Not on file    Attends religious service: Not on file    Active member of club or organization: Not on file    Attends meetings of clubs or organizations: Not on file    Relationship status: Not  on file  . Intimate partner violence:    Fear of current or ex partner: Not on file    Emotionally abused: Not on file    Physically abused: Not on file    Forced sexual activity: Not on file  Other Topics Concern  . Not on file  Social History Narrative  . Not on file    Past Medical History, Surgical history, Social history, and Family history were reviewed and updated as appropriate.   Please see review of systems for further details on the patient's review from today.   Objective:   Physical Exam:  There were no vitals taken for this visit.  Physical Exam Constitutional:      General: He is not in  acute distress.    Appearance: He is well-developed.  Musculoskeletal:        General: No deformity.  Neurological:     Mental Status: He is alert and oriented to person, place, and time.     Motor: No tremor.     Coordination: Coordination normal.     Gait: Gait normal.  Psychiatric:        Mood and Affect: Mood is anxious and depressed. Affect is blunt. Affect is not labile, angry or inappropriate.        Speech: He is noncommunicative.        Behavior: Behavior is slowed.        Thought Content: Thought content normal. Thought content is not paranoid. Thought content does not include homicidal or suicidal ideation. Thought content does not include homicidal or suicidal plan.        Judgment: Judgment normal.     Comments: Insight and judgment fair.  Noticeable social skills deficits longterm. No auditory or visual hallucinations. No delusions.      Lab Review:     Component Value Date/Time   NA 139 07/26/2015 1659   K 4.2 07/26/2015 1659   CL 103 07/26/2015 1659   CO2 30 07/26/2015 1659   GLUCOSE 96 07/26/2015 1659   BUN 21 (H) 07/26/2015 1659   CREATININE 1.21 07/26/2015 1659   CALCIUM 9.4 07/26/2015 1659   GFRNONAA >60 07/26/2015 1659   GFRAA >60 07/26/2015 1659       Component Value Date/Time   WBC 5.7 07/26/2015 1659   RBC 5.32 07/26/2015 1659   HGB 16.9 07/26/2015 1659   HCT 46.6 07/26/2015 1659   PLT 228 07/26/2015 1659   MCV 87.6 07/26/2015 1659   MCH 31.8 07/26/2015 1659   MCHC 36.3 (H) 07/26/2015 1659   RDW 12.7 07/26/2015 1659   LYMPHSABS 2.4 07/26/2015 1659   MONOABS 0.6 07/26/2015 1659   EOSABS 0.1 07/26/2015 1659   BASOSABS 0.1 07/26/2015 1659    No results found for: POCLITH, LITHIUM   No results found for: PHENYTOIN, PHENOBARB, VALPROATE, CBMZ   .res Assessment: Plan:    Severe major depression without psychotic features [facility])  Social anxiety disorder  Also Asberger's disorder   James Morrow is has a has had a recent severe major depression  and severe anxiety disorder that has interfered with his ability to function.  This is complicated by underlying mild to moderate autism features but without cognitive deficits.    Disc dosing options with Rexulti.  Option to increase but he wishes to defer until next visit.  Answered questions about duration of use of Rexulti.  Too early to increase sertraline or change that med.  It needs at least 2 more weeks to evaluate.  Disc SE.  Still doesn't feel ready to increase his work time.  "I'm doing as much as I can do".  No changes made at work.  I need to be able to focus longer and can't yet.  Discussed the need for him to gradually push himself at work to function at a more normal level as soon as he can.  He agreed.  Therefore continue 1/2 time work for another month until Mar 17, 2018.  Expect full recovery.  Discussed potential metabolic side effects associated with atypical antipsychotics, as well as potential risk for movement side effects. Advised pt to contact office if movement side effects occur.   Continue counseling.  He agrees  This appt was 30 mins.  Fu 2 weeks  Meredith Staggersarey Cottle, MD, DFAPA   Please see After Visit Summary for patient specific instructions.  No future appointments.  No orders of the defined types were placed in this encounter.     -------------------------------

## 2018-02-25 ENCOUNTER — Other Ambulatory Visit: Payer: Self-pay | Admitting: Psychiatry

## 2018-02-25 DIAGNOSIS — F332 Major depressive disorder, recurrent severe without psychotic features: Secondary | ICD-10-CM

## 2018-03-10 ENCOUNTER — Encounter: Payer: Self-pay | Admitting: Psychiatry

## 2018-03-10 ENCOUNTER — Ambulatory Visit (INDEPENDENT_AMBULATORY_CARE_PROVIDER_SITE_OTHER): Payer: BLUE CROSS/BLUE SHIELD | Admitting: Psychiatry

## 2018-03-10 DIAGNOSIS — F4010 Social phobia, unspecified: Secondary | ICD-10-CM

## 2018-03-10 DIAGNOSIS — F322 Major depressive disorder, single episode, severe without psychotic features: Secondary | ICD-10-CM

## 2018-03-10 DIAGNOSIS — F411 Generalized anxiety disorder: Secondary | ICD-10-CM | POA: Diagnosis not present

## 2018-03-10 MED ORDER — BREXPIPRAZOLE 2 MG PO TABS: 2.0000 mg | ORAL_TABLET | Freq: Every day | ORAL | 1 refills | Status: DC

## 2018-03-10 NOTE — Progress Notes (Signed)
Lance Boschric N Boulay [id number] April 13, 1990 28 y.o.  Subjective:   Patient ID:  James Morrow is a 28 y.o. (DOB April 13, 1990) male.  Chief Complaint:  Chief Complaint  Patient presents with  . Follow-up    Medication management  . Depression  . concentration problem    HPILast Seen 02/14/18.  Seen frequently DT severe sx and disability. Lance Boschric N Doell presents to the office today for follow-up of depression with disability.  Feels ok physically. Pt reports that mood is Depressed and describes anxiety as Minimal. Anxiety symptoms include: Excessive Worry,. Pt reports no sleep issues and has difficulty awakening. Pt reports that appetite is good. Pt reports that energy is no change and not terrible and no change and poor motivation. Concentration is poor. Suicidal thoughts:  denied by patient recently.  Interest is better than last visit.   Working PT.  Rough to focus a lot of days but seasonally work is slow.  Not really anxious there.  Doesn't think he could concentrate for 7-8 hours in a day at this point.  Generally feels less down.  Rexulti better tolerated with less fatigue than Abilify.  Thinks he's had added beneifit with the increase in sertaline to 200 for 5 weeks and Rexulti for 5 weeks.  Seen counselor Rene PaciNancy Gutman. Review of Systems:  Review of Systems  Neurological: Negative for tremors and weakness.  Psychiatric/Behavioral: Positive for decreased concentration and dysphoric mood. Negative for agitation, behavioral problems, confusion, hallucinations, self-injury, sleep disturbance and suicidal ideas. The patient is nervous/anxious. The patient is not hyperactive.     Medications: I have reviewed the patient's current medications.  Current Outpatient Medications  Medication Sig Dispense Refill  . sertraline (ZOLOFT) 100 MG tablet TAKE 2 TABLETS BY MOUTH EVERY DAY 180 tablet 0  . Brexpiprazole (REXULTI) 2 MG TABS Take 2 mg by mouth daily. 30 tablet 1   No current  facility-administered medications for this visit.     Medication Side Effects: None  Allergies: No Known Allergies  Past Medical History:  Diagnosis Date  . Anxiety disorder   . Depression     History reviewed. No pertinent family history.  Social History   Socioeconomic History  . Marital status: Single    Spouse name: Not on file  . Number of children: Not on file  . Years of education: Not on file  . Highest education level: Not on file  Occupational History  . Not on file  Social Needs  . Financial resource strain: Not on file  . Food insecurity:    Worry: Not on file    Inability: Not on file  . Transportation needs:    Medical: Not on file    Non-medical: Not on file  Tobacco Use  . Smoking status: Never Smoker  . Smokeless tobacco: Never Used  Substance and Sexual Activity  . Alcohol use: Yes    Comment: social  . Drug use: No  . Sexual activity: Not on file  Lifestyle  . Physical activity:    Days per week: Not on file    Minutes per session: Not on file  . Stress: Not on file  Relationships  . Social connections:    Talks on phone: Not on file    Gets together: Not on file    Attends religious service: Not on file    Active member of club or organization: Not on file    Attends meetings of clubs or organizations: Not on file    Relationship  status: Not on file  . Intimate partner violence:    Fear of current or ex partner: Not on file    Emotionally abused: Not on file    Physically abused: Not on file    Forced sexual activity: Not on file  Other Topics Concern  . Not on file  Social History Narrative  . Not on file    Past Medical History, Surgical history, Social history, and Family history were reviewed and updated as appropriate.   Please see review of systems for further details on the patient's review from today.   Objective:   Physical Exam:  There were no vitals taken for this visit.  Physical Exam Constitutional:       General: He is not in acute distress.    Appearance: He is well-developed.  Musculoskeletal:        General: No deformity.  Neurological:     Mental Status: He is alert and oriented to person, place, and time.     Motor: No tremor.     Coordination: Coordination normal.     Gait: Gait normal.  Psychiatric:        Mood and Affect: Mood is depressed. Mood is not anxious. Affect is blunt. Affect is not labile, angry or inappropriate.        Speech: He is noncommunicative.        Behavior: Behavior is slowed.        Thought Content: Thought content normal. Thought content is not paranoid. Thought content does not include homicidal or suicidal ideation. Thought content does not include homicidal or suicidal plan.        Judgment: Judgment normal.     Comments: Insight and judgment fair.  Noticeable social skills deficits longterm. No auditory or visual hallucinations. No delusions.      Lab Review:     Component Value Date/Time   NA 139 07/26/2015 1659   K 4.2 07/26/2015 1659   CL 103 07/26/2015 1659   CO2 30 07/26/2015 1659   GLUCOSE 96 07/26/2015 1659   BUN 21 (H) 07/26/2015 1659   CREATININE 1.21 07/26/2015 1659   CALCIUM 9.4 07/26/2015 1659   GFRNONAA >60 07/26/2015 1659   GFRAA >60 07/26/2015 1659       Component Value Date/Time   WBC 5.7 07/26/2015 1659   RBC 5.32 07/26/2015 1659   HGB 16.9 07/26/2015 1659   HCT 46.6 07/26/2015 1659   PLT 228 07/26/2015 1659   MCV 87.6 07/26/2015 1659   MCH 31.8 07/26/2015 1659   MCHC 36.3 (H) 07/26/2015 1659   RDW 12.7 07/26/2015 1659   LYMPHSABS 2.4 07/26/2015 1659   MONOABS 0.6 07/26/2015 1659   EOSABS 0.1 07/26/2015 1659   BASOSABS 0.1 07/26/2015 1659    No results found for: POCLITH, LITHIUM   No results found for: PHENYTOIN, PHENOBARB, VALPROATE, CBMZ   .res Assessment: Plan:    Severe major depression without psychotic features (HCC) - Plan: Brexpiprazole (REXULTI) 2 MG TABS  Social anxiety disorder  Generalized  anxiety disorder  Also Asberger's disorder   Burdett is has a has had a recent severe major depression and severe anxiety disorder that has interfered with his ability to function.  This is complicated by underlying mild to moderate autism features but without cognitive deficits.    Disc dosing options with Rexulti. As discussed at the last visit since he's not recovered yet we will increase to the usual dosage of 2mg  daily.  Too early to increase sertraline  or change that med.  It needs at least 2 more weeks to evaluate.  Disc SE.  Still doesn't feel ready to increase his work time.  "I'm doing as much as I can do".  No changes made at work.  I need to be able to focus longer and can't yet.  Discussed the need for him to gradually push himself at work to function at a more normal level as soon as he can.  He agreed.  Therefore continue 1/2 time work for another month until Feb 3.  Expect full recovery. Consider other meds for concentration if this doesn't help.  Discussed potential metabolic side effects associated with atypical antipsychotics, as well as potential risk for movement side effects. Advised pt to contact office if movement side effects occur.   Continue counseling.  He agrees  This appt was 30 mins.  Fu 2 weeks  Meredith Staggers, MD, DFAPA   Please see After Visit Summary for patient specific instructions.  Future Appointments  Date Time Provider Department Center  03/24/2018  9:30 AM Cottle, Steva Ready., MD [facility] None    No orders of the defined types were placed in this encounter.     -------------------------------

## 2018-03-24 ENCOUNTER — Encounter: Payer: Self-pay | Admitting: Psychiatry

## 2018-03-24 ENCOUNTER — Ambulatory Visit (INDEPENDENT_AMBULATORY_CARE_PROVIDER_SITE_OTHER): Payer: BLUE CROSS/BLUE SHIELD | Admitting: Psychiatry

## 2018-03-24 DIAGNOSIS — F411 Generalized anxiety disorder: Secondary | ICD-10-CM | POA: Diagnosis not present

## 2018-03-24 DIAGNOSIS — F332 Major depressive disorder, recurrent severe without psychotic features: Secondary | ICD-10-CM | POA: Diagnosis not present

## 2018-03-24 DIAGNOSIS — F4010 Social phobia, unspecified: Secondary | ICD-10-CM | POA: Diagnosis not present

## 2018-03-24 NOTE — Progress Notes (Signed)
James Morrow [id number] 06-25-1990 27 y.o.  Subjective:   Patient ID:  James Morrow is a 28 y.o. (DOB Dec 13, 1990) male.  Chief Complaint:  Chief Complaint  Patient presents with  . Follow-up    Medicatin management  . Depression  . Anxiety    Last Seen January 13.  Seen frequently DT severe sx and disability. James Morrow presents to the office today for follow-up of depression with disability.  At last visit increased Rexulti to 2mg  daily.  No SE.    Pt reports that mood isbetter but still a little bit of  Anxious and Depressed and describes anxiety as Minimal. Anxiety symptoms include: Excessive Worry,. Pt reports no sleep issues. Pt reports that appetite is decreased. Pt reports that energy is good and down slightly. Concentration and motivation is improved. Suicidal thoughts:  denied by patient.  Working PT.  Last week better than it has been.  Rough to focus a lot of days but seasonally work is slow.  Not really anxious there.  Doesn't think he could concentrate for 7-8 hours in a day at this point.  Generally feels less down.  Rexulti better tolerated with less fatigue than Abilify.  Thinks he's had added beneifit with the increase in sertaline to 200  and Rexulti.  Still doesn't feel his confidence back yet and afraid of increasing his hours bc fear of losing progress.  Seen counselor James Morrow. Review of Systems:  Review of Systems  Neurological: Negative for tremors and weakness.  Psychiatric/Behavioral: Positive for decreased concentration, depression and dysphoric mood. Negative for agitation, behavioral problems, confusion, hallucinations, self-injury, sleep disturbance and suicidal ideas. The patient is nervous/anxious. The patient is not hyperactive.     Medications: I have reviewed the patient's current medications.  Current Outpatient Medications  Medication Sig Dispense Refill  . Brexpiprazole (REXULTI) 2 MG TABS Take 2 mg by mouth daily. 30 tablet 1  .  sertraline (ZOLOFT) 100 MG tablet TAKE 2 TABLETS BY MOUTH EVERY DAY 180 tablet 0   No current facility-administered medications for this visit.     Medication Side Effects: None  Allergies: No Known Allergies  Past Medical History:  Diagnosis Date  . Anxiety disorder   . Depression     History reviewed. No pertinent family history.  Social History   Socioeconomic History  . Marital status: Single    Spouse name: Not on file  . Number of children: Not on file  . Years of education: Not on file  . Highest education level: Not on file  Occupational History  . Not on file  Social Needs  . Financial resource strain: Not on file  . Food insecurity:    Worry: Not on file    Inability: Not on file  . Transportation needs:    Medical: Not on file    Non-medical: Not on file  Tobacco Use  . Smoking status: Never Smoker  . Smokeless tobacco: Never Used  Substance and Sexual Activity  . Alcohol use: Yes    Comment: social  . Drug use: No  . Sexual activity: Not on file  Lifestyle  . Physical activity:    Days per week: Not on file    Minutes per session: Not on file  . Stress: Not on file  Relationships  . Social connections:    Talks on phone: Not on file    Gets together: Not on file    Attends religious service: Not on file  Active member of club or organization: Not on file    Attends meetings of clubs or organizations: Not on file    Relationship status: Not on file  . Intimate partner violence:    Fear of current or ex partner: Not on file    Emotionally abused: Not on file    Physically abused: Not on file    Forced sexual activity: Not on file  Other Topics Concern  . Not on file  Social History Narrative  . Not on file    Past Medical History, Surgical history, Social history, and Family history were reviewed and updated as appropriate.   Please see review of systems for further details on the patient's review from today.   Objective:   Physical  Exam:  There were no vitals taken for this visit.  Physical Exam Constitutional:      General: He is not in acute distress.    Appearance: He is well-developed.  Musculoskeletal:        General: No deformity.  Neurological:     Mental Status: He is alert and oriented to person, place, and time.     Motor: No tremor.     Coordination: Coordination normal.     Gait: Gait normal.  Psychiatric:        Mood and Affect: Mood is depressed. Mood is not anxious. Affect is blunt. Affect is not labile, angry or inappropriate.        Speech: He is noncommunicative.        Behavior: Behavior is slowed.        Thought Content: Thought content normal. Thought content is not paranoid. Thought content does not include homicidal or suicidal ideation. Thought content does not include homicidal or suicidal plan.        Judgment: Judgment normal.     Comments: Insight and judgment fair.  Noticeable social skills deficits longterm. No auditory or visual hallucinations. No delusions.      Lab Review:     Component Value Date/Time   NA 139 07/26/2015 1659   K 4.2 07/26/2015 1659   CL 103 07/26/2015 1659   CO2 30 07/26/2015 1659   GLUCOSE 96 07/26/2015 1659   BUN 21 (H) 07/26/2015 1659   CREATININE 1.21 07/26/2015 1659   CALCIUM 9.4 07/26/2015 1659   GFRNONAA >60 07/26/2015 1659   GFRAA >60 07/26/2015 1659       Component Value Date/Time   WBC 5.7 07/26/2015 1659   RBC 5.32 07/26/2015 1659   HGB 16.9 07/26/2015 1659   HCT 46.6 07/26/2015 1659   PLT 228 07/26/2015 1659   MCV 87.6 07/26/2015 1659   MCH 31.8 07/26/2015 1659   MCHC 36.3 (H) 07/26/2015 1659   RDW 12.7 07/26/2015 1659   LYMPHSABS 2.4 07/26/2015 1659   MONOABS 0.6 07/26/2015 1659   EOSABS 0.1 07/26/2015 1659   BASOSABS 0.1 07/26/2015 1659    No results found for: POCLITH, LITHIUM   No results found for: PHENYTOIN, PHENOBARB, VALPROATE, CBMZ   .res Assessment: Plan:    Severe episode of recurrent major depressive  disorder, without psychotic features (HCC)  Social anxiety disorder  Generalized anxiety disorder  Also Asberger's disorder  James Morrow is has a has had a recent severe major depression and severe anxiety disorder that has interfered with his ability to function.  This is complicated by underlying mild to moderate autism features but without cognitive deficits.  He clearly has improved in the last week.  His concentration and interest  are somewhat better.  He does not feel confident about his recovery at this point and is afraid of going full-time and then not being able to do the work.  He's satisfied with current doses of meds.  Tolerating well.  Still doesn't feel ready to increase his work time.  After just 1 week of improvement his confidence is not there yet.Marland Kitchen.  No changes made at work.   Discussed the need for him to gradually push himself at work to function at a more normal level as soon as he can.  He agreed.  Therefore continue 1/2 time work for another month until Feb 3.  We will make a decision either Friday or Monday about perhaps increasing his work time.  He is clearly improved at but as usual confidence is the last thing to improve in the treatment of depression.  Expect full recovery.  Discussed potential metabolic side effects associated with atypical antipsychotics, as well as potential risk for movement side effects. Advised pt to contact office if movement side effects occur.   Continue counseling.  He agrees  This appt was 25 mins.  Fu 3 weeks  Meredith Staggersarey Cottle, MD, DFAPA   Please see After Visit Summary for patient specific instructions.  No future appointments.  No orders of the defined types were placed in this encounter.     -------------------------------

## 2018-04-17 ENCOUNTER — Encounter: Payer: Self-pay | Admitting: Psychiatry

## 2018-04-17 ENCOUNTER — Ambulatory Visit (INDEPENDENT_AMBULATORY_CARE_PROVIDER_SITE_OTHER): Payer: BLUE CROSS/BLUE SHIELD | Admitting: Psychiatry

## 2018-04-17 DIAGNOSIS — F4010 Social phobia, unspecified: Secondary | ICD-10-CM

## 2018-04-17 DIAGNOSIS — F411 Generalized anxiety disorder: Secondary | ICD-10-CM

## 2018-04-17 DIAGNOSIS — F332 Major depressive disorder, recurrent severe without psychotic features: Secondary | ICD-10-CM

## 2018-04-17 NOTE — Progress Notes (Signed)
James Morrow [id number] 31-Aug-1990 28 y.o.  Subjective:   Patient ID:  James Morrow is a 28 y.o. (DOB 11/20/1990) male.  Chief Complaint:  Chief Complaint  Patient presents with  . Follow-up    Medication Management  . Depression  . Anxiety  . job problems    on PT work restrictions    Last Seen January 27.  Seen frequently DT severe sx and disability. James Morrow presents to the office today for follow-up of depression with disability.  At visit January 13 increased Rexulti to 2mg  daily.  No SE.    I feel pretty good.  Further improvement in anxiety and depression. Anxiety symptoms include: Excessive Worry,. Pt reports no sleep issues. Pt reports that appetite is decreased a little but no known weight loss. Pt reports that energy is good and down slightly. Concentration is OK, still a little hard some days at work.  It is better.  Can be hard to pick up something new. Doing usual things outside of work. and motivation is improved. Suicidal thoughts:  denied by patient.  Working PT.  Last weeks better than it has been.  Rough to focus a lot of days but seasonally work is slow.  Not really anxious there.   Generally feels less down.  Rexulti better tolerated with less fatigue than Abilify.  Thinks he's had added beneifit with the increase in sertaline to 200  and Rexulti.  Still doesn't feel his confidence back yet and afraid of increasing his hours bc fear of losing progress.  Seen counselor Rene Paci.  Going pretty well.  Has not talked to her about FT work.  Review of Systems:  Review of Systems  Neurological: Negative for tremors and weakness.  Psychiatric/Behavioral: Positive for dysphoric mood. Negative for agitation, behavioral problems, confusion, decreased concentration, hallucinations, self-injury, sleep disturbance and suicidal ideas. The patient is nervous/anxious. The patient is not hyperactive.     Medications: I have reviewed the patient's current  medications.  Current Outpatient Medications  Medication Sig Dispense Refill  . Brexpiprazole (REXULTI) 2 MG TABS Take 2 mg by mouth daily. 30 tablet 1  . sertraline (ZOLOFT) 100 MG tablet TAKE 2 TABLETS BY MOUTH EVERY DAY 180 tablet 0   No current facility-administered medications for this visit.     Medication Side Effects: None  Allergies: No Known Allergies  Past Medical History:  Diagnosis Date  . Anxiety disorder   . Depression     History reviewed. No pertinent family history.  Social History   Socioeconomic History  . Marital status: Single    Spouse name: Not on file  . Number of children: Not on file  . Years of education: Not on file  . Highest education level: Not on file  Occupational History  . Not on file  Social Needs  . Financial resource strain: Not on file  . Food insecurity:    Worry: Not on file    Inability: Not on file  . Transportation needs:    Medical: Not on file    Non-medical: Not on file  Tobacco Use  . Smoking status: Never Smoker  . Smokeless tobacco: Never Used  Substance and Sexual Activity  . Alcohol use: Yes    Comment: social  . Drug use: No  . Sexual activity: Not on file  Lifestyle  . Physical activity:    Days per week: Not on file    Minutes per session: Not on file  . Stress: Not  on file  Relationships  . Social connections:    Talks on phone: Not on file    Gets together: Not on file    Attends religious service: Not on file    Active member of club or organization: Not on file    Attends meetings of clubs or organizations: Not on file    Relationship status: Not on file  . Intimate partner violence:    Fear of current or ex partner: Not on file    Emotionally abused: Not on file    Physically abused: Not on file    Forced sexual activity: Not on file  Other Topics Concern  . Not on file  Social History Narrative  . Not on file    Past Medical History, Surgical history, Social history, and Family  history were reviewed and updated as appropriate.   Please see review of systems for further details on the patient's review from today.   Objective:   Physical Exam:  There were no vitals taken for this visit.  Physical Exam Constitutional:      General: He is not in acute distress.    Appearance: He is well-developed.  Musculoskeletal:        General: No deformity.  Neurological:     Mental Status: He is alert and oriented to person, place, and time.     Motor: No tremor.     Coordination: Coordination normal.     Gait: Gait normal.  Psychiatric:        Attention and Perception: He is attentive. He does not perceive auditory hallucinations.        Mood and Affect: Mood is anxious and depressed. Affect is blunt. Affect is not labile, angry or inappropriate.        Speech: He is noncommunicative.        Behavior: Behavior is slowed.        Thought Content: Thought content normal. Thought content is not paranoid. Thought content does not include homicidal or suicidal ideation. Thought content does not include homicidal or suicidal plan.        Cognition and Memory: Cognition normal.        Judgment: Judgment normal.     Comments: Insight and judgment fair.  Noticeable social skills deficits longterm. No auditory or visual hallucinations. No delusions.  Anxiety 2/10 and depression varies 3-4/10.     Lab Review:     Component Value Date/Time   NA 139 07/26/2015 1659   K 4.2 07/26/2015 1659   CL 103 07/26/2015 1659   CO2 30 07/26/2015 1659   GLUCOSE 96 07/26/2015 1659   BUN 21 (H) 07/26/2015 1659   CREATININE 1.21 07/26/2015 1659   CALCIUM 9.4 07/26/2015 1659   GFRNONAA >60 07/26/2015 1659   GFRAA >60 07/26/2015 1659       Component Value Date/Time   WBC 5.7 07/26/2015 1659   RBC 5.32 07/26/2015 1659   HGB 16.9 07/26/2015 1659   HCT 46.6 07/26/2015 1659   PLT 228 07/26/2015 1659   MCV 87.6 07/26/2015 1659   MCH 31.8 07/26/2015 1659   MCHC 36.3 (H) 07/26/2015 1659    RDW 12.7 07/26/2015 1659   LYMPHSABS 2.4 07/26/2015 1659   MONOABS 0.6 07/26/2015 1659   EOSABS 0.1 07/26/2015 1659   BASOSABS 0.1 07/26/2015 1659    No results found for: POCLITH, LITHIUM   No results found for: PHENYTOIN, PHENOBARB, VALPROATE, CBMZ   .res Assessment: Plan:    Severe episode of recurrent  major depressive disorder, without psychotic features (HCC)  Social anxiety disorder  Generalized anxiety disorder  Also Asberger's disorder  [place] is has a has had a recent severe major depression and severe anxiety disorder that has interfered with his ability to function.  This is complicated by underlying mild to moderate autism features but without cognitive deficits.  He clearly has improved in the last 3-4 weeks.  His concentration and interest are somewhat better.    He's satisfied with current doses of meds.  Tolerating well.  We both agree that it is time to end work restrictions and have him return FT on Monday upcoming , 04/21/18.   He still feels a bit nervous about it.     No changes made at work.   Discussed the need for him to gradually push himself at work to function at a more normal level as soon as he can.  He agreed.  He is clearly improved at but as usual confidence is the last thing to improve in the treatment of depression.  Expect full recovery.  Discussed potential metabolic side effects associated with atypical antipsychotics, as well as potential risk for movement side effects. Advised pt to contact office if movement side effects occur.   No med changes this visit.  Continue counseling.  He agrees  Call if there are problems with the transition to FT work.  This appt was 25 mins.  Fu 5 weeks  Meredith Staggers, MD, DFAPA   Please see After Visit Summary for patient specific instructions.  Future Appointments  Date Time Provider Department Center  05/22/2018  3:15 PM Cottle, Steva Ready., MD [facility] None    No orders of the defined types were  placed in this encounter.     -------------------------------

## 2018-05-22 ENCOUNTER — Encounter: Payer: Self-pay | Admitting: Psychiatry

## 2018-05-22 ENCOUNTER — Other Ambulatory Visit: Payer: Self-pay

## 2018-05-22 ENCOUNTER — Ambulatory Visit (INDEPENDENT_AMBULATORY_CARE_PROVIDER_SITE_OTHER): Payer: BLUE CROSS/BLUE SHIELD | Admitting: Psychiatry

## 2018-05-22 DIAGNOSIS — F840 Autistic disorder: Secondary | ICD-10-CM | POA: Diagnosis not present

## 2018-05-22 DIAGNOSIS — F4010 Social phobia, unspecified: Secondary | ICD-10-CM

## 2018-05-22 DIAGNOSIS — F332 Major depressive disorder, recurrent severe without psychotic features: Secondary | ICD-10-CM | POA: Diagnosis not present

## 2018-05-22 NOTE — Progress Notes (Signed)
James Morrow [id number] 1990-07-26 28 y.o.  Subjective:   Patient ID:  James Morrow is a 28 y.o. (DOB 12-Jul-1990) male.  Chief Complaint:  Chief Complaint  Patient presents with  . Depression  . Anxiety  . Follow-up    recent disability    Last seen Feb 20.Marland Kitchen  Seen frequently DT severe sx and disability. James Morrow presents to the office today for follow-up of depression with disability.  Been working FT for most part.  Should be done with all that.  Working from home for 2 weeks.  Tech problems are a pain.  I feel pretty good re: mood.  Depression manageable.  Anxiety manageable.  Sleep fine.  At visit January 13 increased Rexulti to  daily.  No SE.    I feel pretty good.  Further improvement in anxiety and depression.  Pt reports no sleep issues. Pt reports that appetite is decreased a little but no known weight loss. Pt reports that energy is good and down slightly. Concentration is OK, still a little hard some days at work.  It is better.  Can be hard to pick up something new. Doing usual things outside of work. and motivation is improved. Suicidal thoughts:  denied by patient.  Working PT.  Last weeks better than it has been.  Rough to focus a lot of days but seasonally work is slow.  Not really anxious there.   Generally feels less down.  Rexulti better tolerated with less fatigue than Abilify.  Thinks he's had added beneifit with the increase in sertaline to 200  and Rexulti.  Still doesn't feel his confidence back yet and afraid of increasing his hours bc fear of losing progress.  Seen counselor James Morrow.  Going pretty well.  Has not talked to her about FT work.  Review of Systems:  Review of Systems  Neurological: Negative for tremors and weakness.  Psychiatric/Behavioral: Negative for agitation, behavioral problems, confusion, decreased concentration, dysphoric mood, hallucinations, self-injury, sleep disturbance and suicidal ideas. The patient is not  nervous/anxious and is not hyperactive.     Medications: I have reviewed the patient's current medications.  Current Outpatient Medications  Medication Sig Dispense Refill  . Brexpiprazole (REXULTI) 2 MG TABS Take 2 mg by mouth daily. 30 tablet 1  . sertraline (ZOLOFT) 100 MG tablet TAKE 2 TABLETS BY MOUTH EVERY DAY 180 tablet 0   No current facility-administered medications for this visit.     Medication Side Effects: None  Allergies: No Known Allergies  Past Medical History:  Diagnosis Date  . Anxiety disorder   . Depression     History reviewed. No pertinent family history.  Social History   Socioeconomic History  . Marital status: Single    Spouse name: Not on file  . Number of children: Not on file  . Years of education: Not on file  . Highest education level: Not on file  Occupational History  . Not on file  Social Needs  . Financial resource strain: Not on file  . Food insecurity:    Worry: Not on file    Inability: Not on file  . Transportation needs:    Medical: Not on file    Non-medical: Not on file  Tobacco Use  . Smoking status: Never Smoker  . Smokeless tobacco: Never Used  Substance and Sexual Activity  . Alcohol use: Yes    Comment: social  . Drug use: No  . Sexual activity: Not on file  Lifestyle  .  Physical activity:    Days per week: Not on file    Minutes per session: Not on file  . Stress: Not on file  Relationships  . Social connections:    Talks on phone: Not on file    Gets together: Not on file    Attends religious service: Not on file    Active member of club or organization: Not on file    Attends meetings of clubs or organizations: Not on file    Relationship status: Not on file  . Intimate partner violence:    Fear of current or ex partner: Not on file    Emotionally abused: Not on file    Physically abused: Not on file    Forced sexual activity: Not on file  Other Topics Concern  . Not on file  Social History  Narrative  . Not on file    Past Medical History, Surgical history, Social history, and Family history were reviewed and updated as appropriate.   Please see review of systems for further details on the patient's review from today.   Objective:   Physical Exam:  There were no vitals taken for this visit.  Physical Exam Constitutional:      General: He is not in acute distress.    Appearance: He is well-developed.  Neurological:     Mental Status: He is alert and oriented to person, place, and time.  Psychiatric:        Attention and Perception: He is attentive. He does not perceive auditory hallucinations.        Mood and Affect: Mood is not anxious or depressed. Affect is blunt. Affect is not labile, angry or inappropriate.        Speech: He is noncommunicative.        Behavior: Behavior is slowed.        Thought Content: Thought content normal. Thought content is not paranoid. Thought content does not include homicidal or suicidal ideation. Thought content does not include homicidal or suicidal plan.        Cognition and Memory: Cognition normal.        Judgment: Judgment normal.     Comments: Insight and judgment fair.  Noticeable social skills deficits longterm. No auditory or visual hallucinations. No delusions.  Although he has a little trouble being entirely aware and able to describe his emotions it appears that his anxiety and depression are managed at this point     Lab Review:     Component Value Date/Time   NA 139 07/26/2015 1659   K 4.2 07/26/2015 1659   CL 103 07/26/2015 1659   CO2 30 07/26/2015 1659   GLUCOSE 96 07/26/2015 1659   BUN 21 (H) 07/26/2015 1659   CREATININE 1.21 07/26/2015 1659   CALCIUM 9.4 07/26/2015 1659   GFRNONAA >60 07/26/2015 1659   GFRAA >60 07/26/2015 1659       Component Value Date/Time   WBC 5.7 07/26/2015 1659   RBC 5.32 07/26/2015 1659   HGB 16.9 07/26/2015 1659   HCT 46.6 07/26/2015 1659   PLT 228 07/26/2015 1659   MCV  87.6 07/26/2015 1659   MCH 31.8 07/26/2015 1659   MCHC 36.3 (H) 07/26/2015 1659   RDW 12.7 07/26/2015 1659   LYMPHSABS 2.4 07/26/2015 1659   MONOABS 0.6 07/26/2015 1659   EOSABS 0.1 07/26/2015 1659   BASOSABS 0.1 07/26/2015 1659    No results found for: POCLITH, LITHIUM   No results found for: PHENYTOIN, PHENOBARB, VALPROATE,  CBMZ   .res Assessment: Plan:    Severe episode of recurrent major depressive disorder, without psychotic features (HCC)  Social anxiety disorder  Autism spectrum disorder  Also Asberger's disorder  James Morrow is has a has had a recent severe major depression and severe anxiety disorder that has interfered with his ability to function.  This is complicated by underlying mild to moderate autism features but without cognitive deficits.  He clearly has improved i and appears to be back to baseline.  He's satisfied with current doses of meds.  Tolerating well.  He is back to working full-time without restrictions.  Discussed potential metabolic side effects associated with atypical antipsychotics, as well as potential risk for movement side effects. Advised pt to contact office if movement side effects occur.   No med changes this visit.  Continue counseling.  He agrees  Fu 8 weeks  I connected with patient by a video enabled telemedicine application or telephone, with their informed consent, and verified patient privacy and that I am speaking with the correct person using two identifiers.  I was located at office and patient at home.  Meredith Staggers, MD, DFAPA   Please see After Visit Summary for patient specific instructions.  No future appointments.  No orders of the defined types were placed in this encounter.     -------------------------------

## 2018-05-31 ENCOUNTER — Other Ambulatory Visit: Payer: Self-pay | Admitting: Psychiatry

## 2018-05-31 DIAGNOSIS — F332 Major depressive disorder, recurrent severe without psychotic features: Secondary | ICD-10-CM

## 2018-11-06 ENCOUNTER — Ambulatory Visit: Payer: BLUE CROSS/BLUE SHIELD | Admitting: Psychiatry

## 2018-11-16 IMAGING — CR DG FOOT COMPLETE 3+V*R*
3 series · 3 of 3 positions shown · non-contrast
Comparison: None.

CLINICAL DATA: Injury while playing softball 3 days prior

EXAM:
RIGHT FOOT COMPLETE - 3+ VIEW

[x foot ap right]
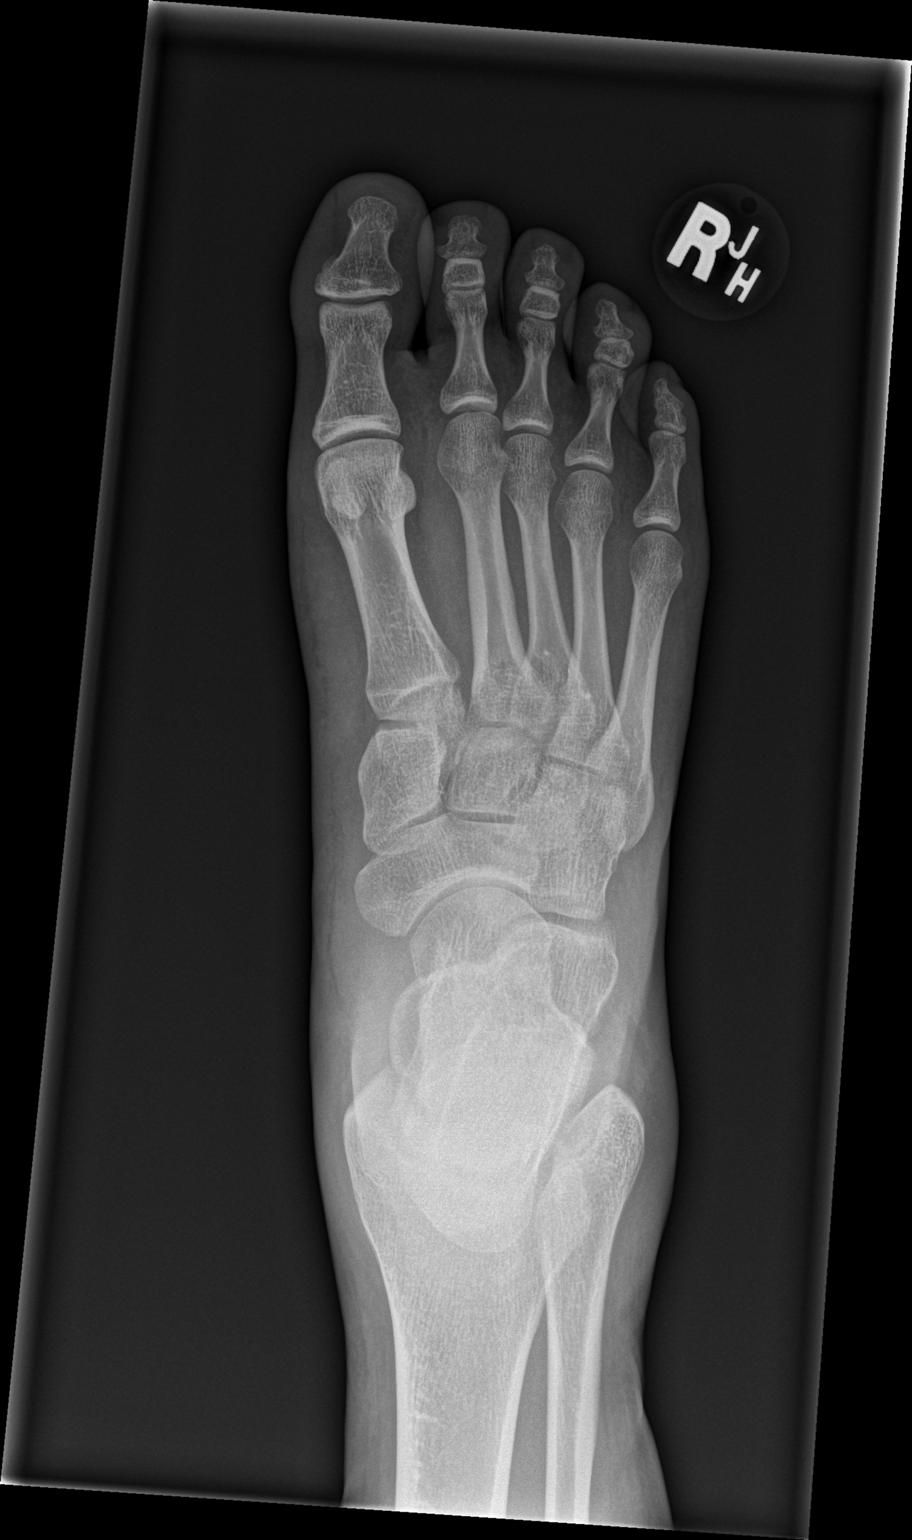

[x foot obl right]
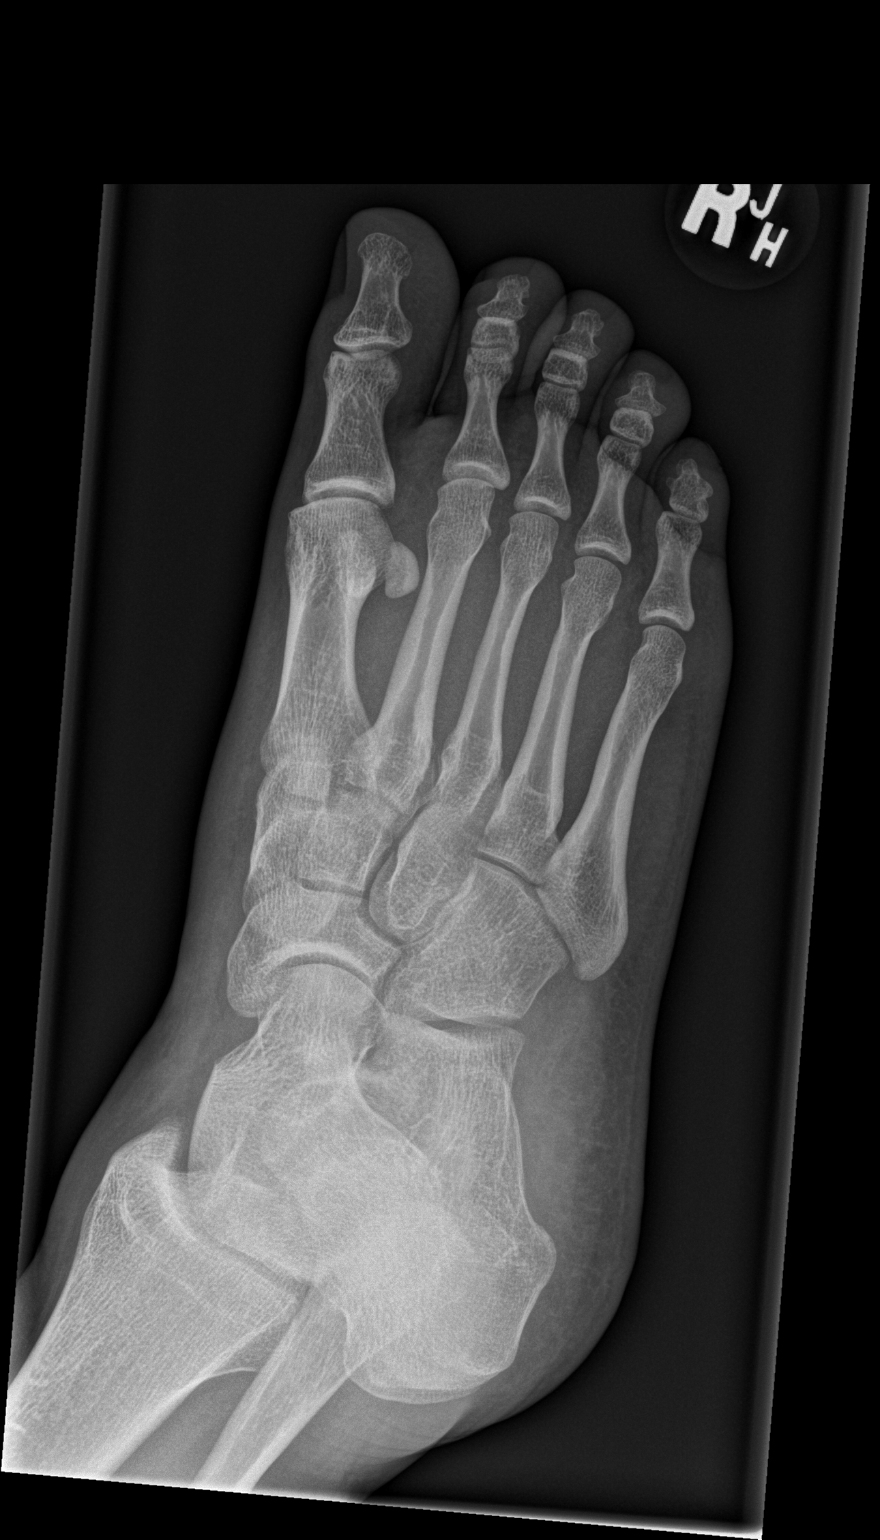

[x foot lat right]
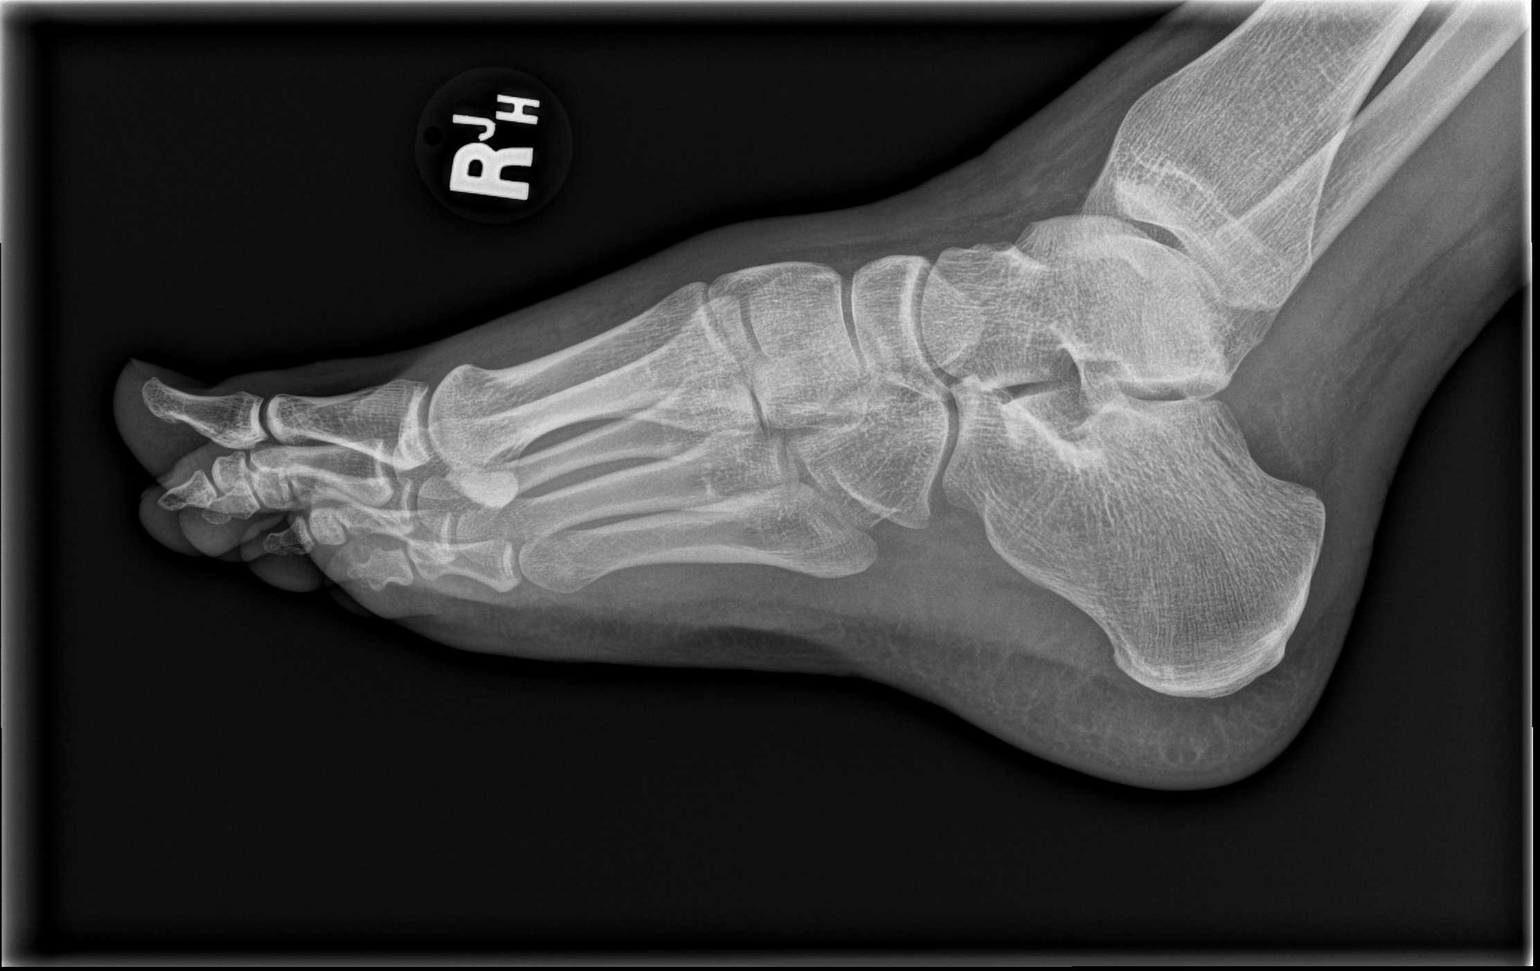

[3 of 3 positions shown; findings below may reference images not displayed]

FINDINGS: Frontal, oblique, and lateral views were obtained. There is no
fracture or dislocation. The joint spaces appear normal. No erosive
change.
IMPRESSION: No fracture or dislocation.  No evident arthropathy.

## 2018-11-27 ENCOUNTER — Telehealth: Payer: Self-pay | Admitting: Psychiatry

## 2018-11-27 NOTE — Telephone Encounter (Signed)
Pt dad Rich left v-mail asking you to return his call. Needs to report things noticed and concerns while visiting Pt. [phone]

## 2018-11-28 NOTE — Telephone Encounter (Signed)
Noted  

## 2018-11-28 NOTE — Telephone Encounter (Signed)
Spoke with [name] and he was just making sure James Morrow had an appt. Scheduled with [place] soon. Next appt. Is 12/29/18.

## 2018-12-29 ENCOUNTER — Encounter: Payer: Self-pay | Admitting: Psychiatry

## 2018-12-29 ENCOUNTER — Other Ambulatory Visit: Payer: Self-pay

## 2018-12-29 ENCOUNTER — Ambulatory Visit (INDEPENDENT_AMBULATORY_CARE_PROVIDER_SITE_OTHER): Payer: BC Managed Care – PPO | Admitting: Psychiatry

## 2018-12-29 DIAGNOSIS — F332 Major depressive disorder, recurrent severe without psychotic features: Secondary | ICD-10-CM | POA: Diagnosis not present

## 2018-12-29 DIAGNOSIS — F411 Generalized anxiety disorder: Secondary | ICD-10-CM

## 2018-12-29 DIAGNOSIS — F840 Autistic disorder: Secondary | ICD-10-CM

## 2018-12-29 DIAGNOSIS — F4010 Social phobia, unspecified: Secondary | ICD-10-CM

## 2018-12-29 NOTE — Progress Notes (Signed)
James Morrow [id number] 03-13-1990 28 y.o.  Subjective:   Patient ID:  James Morrow is a 28 y.o. (DOB Aug 03, 1990) male.  Chief Complaint:  Chief Complaint  Patient presents with  . Follow-up     Medication Management  . Depression     Medication Management  . Anxiety     Medication Management     James Morrow presents to the office today for follow-up of depression with disability.  Last seen March 2020.  No meds were changed.  He continued sertraline 200 mg daily with Rexulti 2 mg daily.  He has missed appointments.  His parents have contacted the office with concerns about his withdrawal and lack of motivation and avoidant behaviors.  They are concerned he is missing some work.  I feel pretty good.  No sig depression.  Anxiety is pretty low overall.  Work is OK, pretty much full time.  Is still working from home.  1-2 days weekly wakes up later if nothing scheduled.  Some complaints from boss over it.  He feels he's made arrangements with the boss that have helped.  At 200 mg sertraline is getting some sexual SE.    I feel pretty good re: mood.  Depression manageable.  Anxiety manageable.  Sleep fine.  At visit January 13 increased Rexulti to 2mg  daily.  No SE.    I feel pretty good.  Further improvement in anxiety and depression.  Pt reports no sleep issues. Pt reports that appetite is decreased a little but no known weight loss. Pt reports that energy is good and down slightly. Concentration is OK, still a little hard some days at work.  It is better.  Can be hard to pick up something new. Doing usual things outside of work. and motivation is improved. Suicidal thoughts:  denied by patient.  Working PT.  Last weeks better than it has been.  Rough to focus a lot of days but seasonally work is slow.  Not really anxious there.   Generally feels less down.  Rexulti better tolerated with less fatigue than Abilify.  Thinks he's had added beneifit with the increase in sertaline to  200  and Rexulti.  Still doesn't feel his confidence back yet and afraid of increasing his hours bc fear of losing progress.  Seen counselor Lurena Nida.  Going pretty well.  Has not talked to her about FT work.  Review of Systems:  Review of Systems  Neurological: Negative for dizziness, tremors and weakness.  Psychiatric/Behavioral: Negative for agitation, behavioral problems, confusion, decreased concentration, dysphoric mood, hallucinations, self-injury, sleep disturbance and suicidal ideas. The patient is not nervous/anxious and is not hyperactive.     Medications: I have reviewed the patient's current medications.  Current Outpatient Medications  Medication Sig Dispense Refill  . Brexpiprazole (REXULTI) 2 MG TABS Take 2 mg by mouth daily. 30 tablet 1  . sertraline (ZOLOFT) 100 MG tablet TAKE 2 TABLETS BY MOUTH EVERY DAY 180 tablet 0   No current facility-administered medications for this visit.     Medication Side Effects: None  Allergies: No Known Allergies  Past Medical History:  Diagnosis Date  . Anxiety disorder   . Depression     History reviewed. No pertinent family history.  Social History   Socioeconomic History  . Marital status: Single    Spouse name: Not on file  . Number of children: Not on file  . Years of education: Not on file  . Highest education level: Not  on file  Occupational History  . Not on file  Social Needs  . Financial resource strain: Not on file  . Food insecurity    Worry: Not on file    Inability: Not on file  . Transportation needs    Medical: Not on file    Non-medical: Not on file  Tobacco Use  . Smoking status: Never Smoker  . Smokeless tobacco: Never Used  Substance and Sexual Activity  . Alcohol use: Yes    Comment: social  . Drug use: No  . Sexual activity: Not on file  Lifestyle  . Physical activity    Days per week: Not on file    Minutes per session: Not on file  . Stress: Not on file  Relationships  .  Social Musicianconnections    Talks on phone: Not on file    Gets together: Not on file    Attends religious service: Not on file    Active member of club or organization: Not on file    Attends meetings of clubs or organizations: Not on file    Relationship status: Not on file  . Intimate partner violence    Fear of current or ex partner: Not on file    Emotionally abused: Not on file    Physically abused: Not on file    Forced sexual activity: Not on file  Other Topics Concern  . Not on file  Social History Narrative  . Not on file    Past Medical History, Surgical history, Social history, and Family history were reviewed and updated as appropriate.   Please see review of systems for further details on the patient's review from today.   Objective:   Physical Exam:  There were no vitals taken for this visit.  Physical Exam Constitutional:      General: He is not in acute distress.    Appearance: He is well-developed.  Neurological:     Mental Status: He is alert and oriented to person, place, and time.  Psychiatric:        Attention and Perception: He is attentive. He does not perceive auditory hallucinations.        Mood and Affect: Mood is not anxious or depressed. Affect is blunt. Affect is not labile, angry or inappropriate.        Speech: He is noncommunicative.        Behavior: Behavior is slowed.        Thought Content: Thought content normal. Thought content is not paranoid. Thought content does not include homicidal or suicidal ideation. Thought content does not include homicidal or suicidal plan.        Cognition and Memory: Cognition normal.        Judgment: Judgment normal.     Comments: Insight and judgment fair.  Noticeable social skills deficits longterm. No auditory or visual hallucinations. No delusions.  Although he has a little trouble being entirely aware and able to describe his emotions it appears that his anxiety and depression are managed at this point      Lab Review:     Component Value Date/Time   NA 139 07/26/2015 1659   K 4.2 07/26/2015 1659   CL 103 07/26/2015 1659   CO2 30 07/26/2015 1659   GLUCOSE 96 07/26/2015 1659   BUN 21 (H) 07/26/2015 1659   CREATININE 1.21 07/26/2015 1659   CALCIUM 9.4 07/26/2015 1659   GFRNONAA >60 07/26/2015 1659   GFRAA >60 07/26/2015 1659  Component Value Date/Time   WBC 5.7 07/26/2015 1659   RBC 5.32 07/26/2015 1659   HGB 16.9 07/26/2015 1659   HCT 46.6 07/26/2015 1659   PLT 228 07/26/2015 1659   MCV 87.6 07/26/2015 1659   MCH 31.8 07/26/2015 1659   MCHC 36.3 (H) 07/26/2015 1659   RDW 12.7 07/26/2015 1659   LYMPHSABS 2.4 07/26/2015 1659   MONOABS 0.6 07/26/2015 1659   EOSABS 0.1 07/26/2015 1659   BASOSABS 0.1 07/26/2015 1659    No results found for: POCLITH, LITHIUM   No results found for: PHENYTOIN, PHENOBARB, VALPROATE, CBMZ   .res Assessment: Plan:    Generalized anxiety disorder  Severe episode of recurrent major depressive disorder, without psychotic features (HCC)  Social anxiety disorder  Autism spectrum disorder  Also Asberger's disorder  James Morrow is has a has had a recent severe major depression and severe anxiety disorder that has interfered with his ability to function.  This is complicated by underlying mild to moderate autism features but without cognitive deficits.    He's not satisfied with current doses of meds bc of sexualSE.  consigher switch vs risk reduction.Marland Kitchen  He is back to working full-time without restrictions.  Discussed potential metabolic side effects associated with atypical antipsychotics, as well as potential risk for movement side effects. Advised pt to contact office if movement side effects occur.   Reduce sertraline 150  To reduce sexual SE.  If sx worsen call.    Continue counseling.  He agrees  Fu 8 weeks  Meredith Staggers, MD, DFAPA   Please see After Visit Summary for patient specific instructions.  No future  appointments.  No orders of the defined types were placed in this encounter.     -------------------------------

## 2019-03-18 ENCOUNTER — Ambulatory Visit (INDEPENDENT_AMBULATORY_CARE_PROVIDER_SITE_OTHER): Payer: BC Managed Care – PPO | Admitting: Psychiatry

## 2019-03-18 ENCOUNTER — Encounter: Payer: Self-pay | Admitting: Psychiatry

## 2019-03-18 ENCOUNTER — Other Ambulatory Visit: Payer: Self-pay

## 2019-03-18 DIAGNOSIS — F332 Major depressive disorder, recurrent severe without psychotic features: Secondary | ICD-10-CM

## 2019-03-18 DIAGNOSIS — F4010 Social phobia, unspecified: Secondary | ICD-10-CM

## 2019-03-18 DIAGNOSIS — F840 Autistic disorder: Secondary | ICD-10-CM | POA: Diagnosis not present

## 2019-03-18 MED ORDER — DULOXETINE HCL 30 MG PO CPEP: ORAL_CAPSULE | ORAL | 0 refills | Status: DC

## 2019-03-18 NOTE — Progress Notes (Signed)
James Morrow [id number] [birth year] 29 y.o.  Subjective:   Patient ID:  James Morrow is a 29 y.o. (DOB 11/24/1990) male.  Chief Complaint:  Chief Complaint  Patient presents with  . Follow-up    Medication Management  . Anxiety    Medication Management  . Depression    Medication Management     James Morrow presents to the office today for follow-up of depression with disability.  seen March 2020.  No meds were changed.  He continued sertraline 200 mg daily with Rexulti 2 mg daily.  He has missed appointments.  His Morrow have contacted the office with concerns about his withdrawal and lack of motivation and avoidant behaviors.  They are concerned he is missing some work.  Last seen December 29, 2018.  We reduce sertraline from 200 mg daily to 150 mg daily to reduce sexual side effects.  Worse.  Not hungry and not motivated both within and outside work.  Not sure when things got worse.  Flat.  Not a strong sense of sadness.  Energy a little down.  No weight change.  No change in anxiety.  A little worry over things to do.  Work from home with [employer] technically FT but not very busy.  Occ CO from boss.  Missing work at least once weekly. Sleep 6-8 hours.  No change in sexual SE.  Not Seen counselor Rene Paci.   Past Psychiatric Medication Trials: sertraline 200, Wellbutrin NR At visit March 10, 2018 increased Rexulti to 2mg  daily.  Review of Systems:  Review of Systems  Constitutional: Positive for appetite change.  Neurological: Negative for dizziness, tremors and weakness.  Psychiatric/Behavioral: Negative for agitation, behavioral problems, confusion, decreased concentration, dysphoric mood, hallucinations, self-injury, sleep disturbance and suicidal ideas. The patient is not nervous/anxious and is not hyperactive.     Medications: I have reviewed the patient's current medications.  Current Outpatient Medications  Medication Sig Dispense Refill  . Brexpiprazole  (REXULTI) 2 MG TABS Take 2 mg by mouth daily. 30 tablet 1  . DULoxetine (CYMBALTA) 30 MG capsule 1 daily for 1 week, then 2 daily 30 capsule 0   No current facility-administered medications for this visit.    Medication Side Effects: None  Allergies: No Known Allergies  Past Medical History:  Diagnosis Date  . Anxiety disorder   . Depression     History reviewed. No pertinent family history.  Social History   Socioeconomic History  . Marital status: Single    Spouse name: Not on file  . Number of children: Not on file  . Years of education: Not on file  . Highest education level: Not on file  Occupational History  . Not on file  Tobacco Use  . Smoking status: Never Smoker  . Smokeless tobacco: Never Used  Substance and Sexual Activity  . Alcohol use: Yes    Comment: social  . Drug use: No  . Sexual activity: Not on file  Other Topics Concern  . Not on file  Social History Narrative  . Not on file   Social Determinants of Health   Financial Resource Strain:   . Difficulty of Paying Living Expenses: Not on file  Food Insecurity:   . Worried About in the Last Year: Not on file  . Ran Out of Food in the Last Year: Not on file  Transportation Needs:   . Lack of Transportation (Medical): Not on file  . Lack of Transportation (Non-Medical):  Not on file  Physical Activity:   . Days of Exercise per Week: Not on file  . Minutes of Exercise per Session: Not on file  Stress:   . Feeling of Stress : Not on file  Social Connections:   . Frequency of Communication with Friends and Family: Not on file  . Frequency of Social Gatherings with Friends and Family: Not on file  . Attends Religious Services: Not on file  . Active Member of Clubs or Organizations: Not on file  . Attends Banker Meetings: Not on file  . Marital Status: Not on file  Intimate Partner Violence:   . Fear of Current or Ex-Partner: Not on file  . Emotionally Abused:  Not on file  . Physically Abused: Not on file  . Sexually Abused: Not on file    Past Medical History, Surgical history, Social history, and Family history were reviewed and updated as appropriate.   Please see review of systems for further details on the patient's review from today.   Objective:   Physical Exam:  There were no vitals taken for this visit.  Physical Exam Constitutional:      General: He is not in acute distress.    Appearance: He is well-developed.  Neurological:     Mental Status: He is alert and oriented to person, place, and time.  Psychiatric:        Attention and Perception: He is attentive. He does not perceive auditory hallucinations.        Mood and Affect: Mood is depressed. Mood is not anxious. Affect is blunt. Affect is not labile, angry or inappropriate.        Speech: He is noncommunicative.        Behavior: Behavior is slowed.        Thought Content: Thought content normal. Thought content is not paranoid. Thought content does not include homicidal or suicidal ideation. Thought content does not include homicidal or suicidal plan.        Cognition and Memory: Cognition normal.        Judgment: Judgment normal.     Comments: Insight and judgment fair.  Noticeable social skills deficits longterm. No auditory or visual hallucinations. No delusions.  Closed posture. hypoverbal per usual.     Lab Review:     Component Value Date/Time   NA 139 07/26/2015 1659   K 4.2 07/26/2015 1659   CL 103 07/26/2015 1659   CO2 30 07/26/2015 1659   GLUCOSE 96 07/26/2015 1659   BUN 21 (H) 07/26/2015 1659   CREATININE 1.21 07/26/2015 1659   CALCIUM 9.4 07/26/2015 1659   GFRNONAA >60 07/26/2015 1659   GFRAA >60 07/26/2015 1659       Component Value Date/Time   WBC 5.7 07/26/2015 1659   RBC 5.32 07/26/2015 1659   HGB 16.9 07/26/2015 1659   HCT 46.6 07/26/2015 1659   PLT 228 07/26/2015 1659   MCV 87.6 07/26/2015 1659   MCH 31.8 07/26/2015 1659   MCHC  36.3 (H) 07/26/2015 1659   RDW 12.7 07/26/2015 1659   LYMPHSABS 2.4 07/26/2015 1659   MONOABS 0.6 07/26/2015 1659   EOSABS 0.1 07/26/2015 1659   BASOSABS 0.1 07/26/2015 1659    No results found for: POCLITH, LITHIUM   No results found for: PHENYTOIN, PHENOBARB, VALPROATE, CBMZ   .res Assessment: Plan:    Severe episode of recurrent major depressive disorder, without psychotic features (HCC) - Plan: DULoxetine (CYMBALTA) 30 MG capsule  Social anxiety disorder  Autism spectrum disorder  Also Asberger's disorder  James Morrow has had severe major depression and severe anxiety disorder that has interfered with his ability to function.  This is complicated by underlying mild to moderate autism features but without cognitive deficits.  More recently he describes himself as "stuck" with low motivation and poor productivity both at work and outside of work.  He is interested in a medication change.  Worked on insight into how sx affect his QOL.  "Trapped in a cycle of basically doing noting"     He is back to working full-time without restrictions.  Discussed potential metabolic side effects associated with atypical antipsychotics, as well as potential risk for movement side effects. Advised pt to contact office if movement side effects occur.   Start duloxetine 30 mg capsule 1 daily and reduce sertraline to 1 daily for 7 days, Then increase duloxetine to 2 capsules daily and reduce sertraline to 1/2 tablet daily for 5 days and then stop sertraline  Continue Rexulti for now.  If the duloxetine is markedly helpful then we may discontinue it.  Return to counseling.  He agrees.  Referred to Rinaldo Cloud.  Fu 6 weeks  James Parents, MD, DFAPA   Please see After Visit Summary for patient specific instructions.  Future Appointments  Date Time Provider [place]  03/27/2019  3:00 PM Shanon Ace, LCSW [facility] None    No orders of the defined types were placed in this encounter.      -------------------------------

## 2019-03-18 NOTE — Patient Instructions (Addendum)
Start duloxetine 30 mg capsule 1 daily and reduce sertraline to 1 daily for 7 days, Then increase duloxetine to 2 capsules daily and reduce sertraline to 1/2 tablet daily for 5 days and then stop sertraline

## 2019-03-27 ENCOUNTER — Ambulatory Visit: Payer: BC Managed Care – PPO | Admitting: Psychiatry

## 2019-04-06 ENCOUNTER — Ambulatory Visit (INDEPENDENT_AMBULATORY_CARE_PROVIDER_SITE_OTHER): Payer: BC Managed Care – PPO | Admitting: Psychiatry

## 2019-04-06 ENCOUNTER — Other Ambulatory Visit: Payer: Self-pay

## 2019-04-06 DIAGNOSIS — F332 Major depressive disorder, recurrent severe without psychotic features: Secondary | ICD-10-CM

## 2019-04-06 NOTE — Progress Notes (Signed)
[facility] Counselor/Therapist Progress Note  Patient ID: James Morrow, MRN: [id number],    Date: 04/06/2019  Time Spent: 60 minutes  9:05am to 10:05am  Treatment Type: Individual Therapy  Reported Symptoms: depression, anxiety; Dad was in session (with patient's permission)  a few minutes initially  and reports his prior concerns of patient's depression, prior SI, and "I want him to enjoy a full life" .  Dad later went to waiting room and I continued with patient.  Communication with father strained and Dad says they want it to be better and patient states "yes" he is willing to work on that. Patient later indicated some hesitancy.  *Patient last seen by another therapist here approx. 2 1/2 yrs ago.  Mental Status Exam:  Appearance:   Casual     Behavior:  Sharing, but slowed some  Motor:  Normal  Speech/Language:   Normal Rate; slowed at times and soft-spoken  Affect:  Depressed  Mood:  anxious and depressed  Thought process:  normal  Thought content:    WNL  Sensory/Perceptual disturbances:    WNL  Orientation:  oriented to person, place, time/date, situation, day of week, month of year and year  Attention:  Good  Concentration:  Good  Memory:  WNL  Fund of knowledge:   Good  Insight:    Good/Fair  Judgment:   Good/Fair  Impulse Control:  Patient reports Good.   Risk Assessment: Danger to Self:  No Self-injurious Behavior: No Danger to Others: No Duty to Warn:no Physical Aggression / Violence:No  Access to Firearms a concern: No  Gang Involvement:No   Subjective: Patient in today and father sat in initial part of session (with patient permission) and then waited in lobby while I saw patient. Dad had wanted to share with patient permission some of their lack of communication and how it might be improved. Patient is not very communicative although spoke a little more after dad left office. Worked today to gather some history and establish some rapport and a  beginning tx plan for patient.    Interventions: Solution-Oriented/Positive Psychology  Diagnosis:   ICD-10-CM   1. Severe episode of recurrent major depressive disorder, without psychotic features (HCC)  F33.2     Plan:  Tentative--to be reviewed with some addition next visit, to focus on depression and other need areas. Patient not signing tx plan on computer screen due to Covid-19.  Treatment Goals: Goals remain on tx plan as patient works on strategies to achieve his goals. Progress is noted each session in "Progress" section of Plan.  Long Term Goal: Alleviate depressed mood and return to previous level of effective functioning. Patient will report no longer having suicidal thoughts for a period of at least 60/90 days.  Short Term Goal:  Verbally express understanding of relationship between depressed mood and repression of feelings such as anger, hurt, and sadness.  Strategies:  Identify and replace depressive thinking that leads to depressive feelings and actions.(Due to patient's current situation and status we are beginning with 1 strategy at this time and can add on as appropriate.)  Progress: This is patient's first session today with this therapist.  Communication is a challenge for him and getting some history and current info from patient, this initial tx plan was formed and agreed upon.  Patient had difficulty stating what he feels are the "negatives" or "positives" in his life right now but stated he would think about his more and "changes he wants to  make" prior to next session.  History includes: being on Autism Spectrum, difficulties having/maintaing social involvement and social connection, communication issues, distances from family and others, difficulty maintaining a job but does work from home currently for [employer] however admits today that he has not been thorough nor completing all of his work more recently. Eye contact was minimal initially but did improve as  session progressed. Verbal communication limited. Does appear depressed but denies any SI. The plan for this patient is to focus on his individual needs and also trying to help form some improved relationship/communication with family, starting with his dad. To continue meds per Dr. Lynder Parents.   Next appt within 2 weeks.     Shanon Ace, LCSW

## 2019-04-14 ENCOUNTER — Ambulatory Visit (INDEPENDENT_AMBULATORY_CARE_PROVIDER_SITE_OTHER): Payer: BC Managed Care – PPO | Admitting: Psychiatry

## 2019-04-14 ENCOUNTER — Other Ambulatory Visit: Payer: Self-pay

## 2019-04-14 DIAGNOSIS — F332 Major depressive disorder, recurrent severe without psychotic features: Secondary | ICD-10-CM | POA: Diagnosis not present

## 2019-04-14 NOTE — Progress Notes (Signed)
[facility] Counselor/Therapist Progress Note  Patient ID: James Morrow, MRN: [id number],    Date: 04/14/2019  Time Spent: 60 minutes  12:00noon to 1:00pm  Treatment Type: Individual Therapy  Reported Symptoms: depression, anxiety, sleeping a lot, can't concentrate, lower motivation, less appettite, Feel he doesn't fit in with people and wishes he did. Would like to eventually "have a different job.  Might like to date.  Talking to people is hard."  Mental Status Exam:  Appearance:   Casual     Behavior:  Sharing  Motor:  Normal  Speech/Language:   softer spoken, but clear and coherent  Affect:  Blunt  Mood:  anxious and depressed  Thought process:  goal directed  Thought content:    WNL  Sensory/Perceptual disturbances:    WNL  Orientation:  oriented to person, place, time/date, situation, day of week, month of year and year  Attention:  Good  Concentration:  Good  Memory:  WNL  Fund of knowledge:   Good  Insight:    Good  Judgment:   Good and Good / Fair  Impulse Control:  Good   Risk Assessment: Danger to Self:  No Self-injurious Behavior: No Danger to Others: No Duty to Warn:no Physical Aggression / Violence:No  Access to Firearms a concern: No  Gang Involvement:No   Subjective: Patient in today reporting depression, anxiety, sleeping a lot, can't concentrate, lower motivation.  Feels he doesn't fit in with people and "sometimes" wishes he did. Would like to eventually "have a different job.  Might like to date.  Talking to people is hard."   Interventions: Cognitive Behavioral Therapy and Ego-Supportive  Diagnosis:   ICD-10-CM   1. Severe episode of recurrent major depressive disorder, without psychotic features (HCC)  F33.2              Plan:   Patient not signing tx plan on computer screen due to Covid-19.  Treatment Goals: Goals remain on tx plan as patient works on strategies to achieve his goals. Progress is noted each session in "Progress"  section of Plan. With some autism involved, patient has some specific need areas in addition to his anxiety and depression including difficulty speaking up, lack of goal for future and states he needs a long-term plan (job/finances), states he needs to "be applying more consistency to what he does each day" including having more of a schedule or routine, difficulty having/maintaining social involvement and social connection, communication issues, distances himself from family and others, and difficulty maintaining a job however is working at this time from home.  Long Term Goal: Alleviate depressed mood and return to previous level of effective functioning. Patient will report no longer having suicidal thoughts for a period of at least 60/90 days.  Short Term Goal:  Verbally express understanding of relationship between depressed mood and repression of feelings such as anger, hurt, and sadness.  Strategies:  Identify and replace depressive thinking that leads to depressive feelings and actions.(Due to patient's current situation and status we are beginning with 1 strategy at this time and can add on as appropriate.)  Progress: Patient has history of being on autism spectrum.  He speaks some slower and reports being a  "9 out of 10 for depression scale" and denies any SI. Has difficulty speaking up and does respond to questions even if more slowly. "Not sure what I want but know I do need a long term plan", referring to job/finances. States that he needs to  be "applying more consistency to what I do each day", may develop a schedule or routine that may help him, such as getting up and starting work on a more specific schedule. Admits his work on the job has not been as good more recently.  Patient did communicate a little more openly today, even asking a couple questions himself. Looked at negatives vs positives. Eye contact overall better today. Continues to deny any SI.  Plan will continue to focus  on his individual needs and eventual work on family communication and relationship (starting with dad). Goal review and progress noted  with patient.   Next apptwithin 2-3 weeks.    Shanon Ace, LCSW

## 2019-04-29 ENCOUNTER — Ambulatory Visit: Payer: BC Managed Care – PPO | Admitting: Psychiatry

## 2019-04-29 ENCOUNTER — Other Ambulatory Visit: Payer: Self-pay

## 2019-04-29 ENCOUNTER — Encounter: Payer: Self-pay | Admitting: Psychiatry

## 2019-04-29 ENCOUNTER — Ambulatory Visit (INDEPENDENT_AMBULATORY_CARE_PROVIDER_SITE_OTHER): Payer: BC Managed Care – PPO | Admitting: Psychiatry

## 2019-04-29 DIAGNOSIS — F411 Generalized anxiety disorder: Secondary | ICD-10-CM

## 2019-04-29 DIAGNOSIS — F4010 Social phobia, unspecified: Secondary | ICD-10-CM

## 2019-04-29 DIAGNOSIS — F332 Major depressive disorder, recurrent severe without psychotic features: Secondary | ICD-10-CM

## 2019-04-29 DIAGNOSIS — F840 Autistic disorder: Secondary | ICD-10-CM

## 2019-04-29 MED ORDER — DULOXETINE HCL 60 MG PO CPEP: 60.0000 mg | ORAL_CAPSULE | Freq: Every day | ORAL | 1 refills | Status: DC

## 2019-04-29 NOTE — Progress Notes (Addendum)
James Morrow [id number] 1990-08-09 29 y.o.  Subjective:   Patient ID:  James Morrow is a 29 y.o. (DOB September 04, 1990) male.  Chief Complaint:  Chief Complaint  Patient presents with  . Depression  . Anxiety  . Follow-up    med changes     James Morrow presents to the office today for follow-up of depression with disability.  seen March 2020.  No meds were changed.  He continued sertraline 200 mg daily with Rexulti 2 mg daily.  He has missed appointments.  His parents have contacted the office with concerns about his withdrawal and lack of motivation and avoidant behaviors.  They are concerned he is missing some work.  seen December 29, 2018.  We reduce sertraline from 200 mg daily to 150 mg daily to reduce sexual side effects.  Last seen March 18, 2019.  He was more depressed.  We switched him from sertraline to duloxetine in hopes of improving the depression without worsening sexual side effects.  He continued the Rexulti until we could reevaluate the benefit of duloxetine.  Should be taking duloxetine 60 but he forgot to increase above 30 mg daily.  No problems with the tranistion. No problems nor benefit.  Other sx continue as noted.   Not hungry and not motivated both within and outside work.  Not sure when things got worse.  Flat.  Not a strong sense of sadness.  Energy a little down.  No weight change.  No change in anxiety.  A little worry over things to do.  Work from home with [employer] technically FT but not very busy.  Occ CO from boss.  Missing work at least once weekly. Sleep 6-8 hours.  Started with counselor Rinaldo Cloud.   Past Psychiatric Medication Trials: sertraline 200, Wellbutrin NR At visit March 10, 2018 increased Rexulti to 2mg  daily.  Review of Systems:  Review of Systems  Constitutional: Positive for appetite change.  Neurological: Negative for dizziness, tremors and weakness.  Psychiatric/Behavioral: Negative for agitation, behavioral problems, confusion,  decreased concentration, dysphoric mood, hallucinations, self-injury, sleep disturbance and suicidal ideas. The patient is not nervous/anxious and is not hyperactive.     Medications: I have reviewed the patient's current medications.  Current Outpatient Medications  Medication Sig Dispense Refill  . Brexpiprazole (REXULTI) 2 MG TABS Take 2 mg by mouth daily. 30 tablet 1  . DULoxetine (CYMBALTA) 60 MG capsule Take 1 capsule (60 mg total) by mouth daily. 30 capsule 1   No current facility-administered medications for this visit.    Medication Side Effects: None  Allergies: No Known Allergies  Past Medical History:  Diagnosis Date  . Anxiety disorder   . Depression     History reviewed. No pertinent family history.  Social History   Socioeconomic History  . Marital status: Single    Spouse name: Not on file  . Number of children: Not on file  . Years of education: Not on file  . Highest education level: Not on file  Occupational History  . Not on file  Tobacco Use  . Smoking status: Never Smoker  . Smokeless tobacco: Never Used  Substance and Sexual Activity  . Alcohol use: Yes    Comment: social  . Drug use: No  . Sexual activity: Not on file  Other Topics Concern  . Not on file  Social History Narrative  . Not on file   Social Determinants of Health   Financial Resource Strain:   . Difficulty of  Paying Living Expenses: Not on file  Food Insecurity:   . Worried About Programme researcher, broadcasting/film/video in the Last Year: Not on file  . Ran Out of Food in the Last Year: Not on file  Transportation Needs:   . Lack of Transportation (Medical): Not on file  . Lack of Transportation (Non-Medical): Not on file  Physical Activity:   . Days of Exercise per Week: Not on file  . Minutes of Exercise per Session: Not on file  Stress:   . Feeling of Stress : Not on file  Social Connections:   . Frequency of Communication with Friends and Family: Not on file  . Frequency of Social  Gatherings with Friends and Family: Not on file  . Attends Religious Services: Not on file  . Active Member of Clubs or Organizations: Not on file  . Attends Banker Meetings: Not on file  . Marital Status: Not on file  Intimate Partner Violence:   . Fear of Current or Ex-Partner: Not on file  . Emotionally Abused: Not on file  . Physically Abused: Not on file  . Sexually Abused: Not on file    Past Medical History, Surgical history, Social history, and Family history were reviewed and updated as appropriate.   Please see review of systems for further details on the patient's review from today.   Objective:   Physical Exam:  There were no vitals taken for this visit.  Physical Exam Constitutional:      General: He is not in acute distress.    Appearance: He is well-developed.  Neurological:     Mental Status: He is alert and oriented to person, place, and time.  Psychiatric:        Attention and Perception: He is attentive. He does not perceive auditory hallucinations.        Mood and Affect: Mood is depressed. Mood is not anxious. Affect is blunt. Affect is not labile, angry or inappropriate.        Speech: He is noncommunicative.        Behavior: Behavior is slowed.        Thought Content: Thought content normal. Thought content is not paranoid. Thought content does not include homicidal or suicidal ideation. Thought content does not include homicidal or suicidal plan.        Cognition and Memory: Cognition normal.        Judgment: Judgment normal.     Comments: Insight and judgment fair.  Noticeable social skills deficits longterm. No auditory or visual hallucinations. No delusions.  Closed posture. hypoverbal per usual.     Lab Review:     Component Value Date/Time   NA 139 07/26/2015 1659   K 4.2 07/26/2015 1659   CL 103 07/26/2015 1659   CO2 30 07/26/2015 1659   GLUCOSE 96 07/26/2015 1659   BUN 21 (H) 07/26/2015 1659   CREATININE 1.21 07/26/2015  1659   CALCIUM 9.4 07/26/2015 1659   GFRNONAA >60 07/26/2015 1659   GFRAA >60 07/26/2015 1659       Component Value Date/Time   WBC 5.7 07/26/2015 1659   RBC 5.32 07/26/2015 1659   HGB 16.9 07/26/2015 1659   HCT 46.6 07/26/2015 1659   PLT 228 07/26/2015 1659   MCV 87.6 07/26/2015 1659   MCH 31.8 07/26/2015 1659   MCHC 36.3 (H) 07/26/2015 1659   RDW 12.7 07/26/2015 1659   LYMPHSABS 2.4 07/26/2015 1659   MONOABS 0.6 07/26/2015 1659   EOSABS 0.1  07/26/2015 1659   BASOSABS 0.1 07/26/2015 1659    No results found for: POCLITH, LITHIUM   No results found for: PHENYTOIN, PHENOBARB, VALPROATE, CBMZ   .res Assessment: Plan:    Severe episode of recurrent major depressive disorder, without psychotic features (HCC) - Plan: DULoxetine (CYMBALTA) 60 MG capsule  Social anxiety disorder  Generalized anxiety disorder  Autism spectrum disorder  Also Asberger's disorder  Zakai has had severe major depression and severe anxiety disorder that has interfered with his ability to function.  This is complicated by underlying mild to moderate autism features but without cognitive deficits.  More recently he describes himself as "stuck" with low motivation and poor productivity both at work and outside of work.  He was noted to have depressive symptoms at his last visit and sertraline was changed to duloxetine.  Unfortunately he did not realize he needed to increase the duloxetine to 60 mg daily so he has been taking 30 mg daily which is in an adequate dosage.  Worked on insight into how sx affect his QOL.  "Trapped in a cycle of basically doing noting"     He is back to working full-time without restrictions.  Discussed potential metabolic side effects associated with atypical antipsychotics, as well as potential risk for movement side effects. Advised pt to contact office if movement side effects occur.   Increase duloxetine to 60 mg daily.  Continue Rexulti for now.  If the duloxetine is  markedly helpful then we may discontinue it.  Return to counseling.  He agrees.  Referred to Rockne Menghini.  And he has started counseling with her.  Discussed case with her today.  Fu 6 weeks  Meredith Staggers, MD, DFAPA   Please see After Visit Summary for patient specific instructions.  Future Appointments  Date Time Provider Department Center  05/14/2019  1:00 PM Mathis Fare, LCSW [facility] None    No orders of the defined types were placed in this encounter.     -------------------------------

## 2019-05-14 ENCOUNTER — Other Ambulatory Visit: Payer: Self-pay

## 2019-05-14 ENCOUNTER — Ambulatory Visit (INDEPENDENT_AMBULATORY_CARE_PROVIDER_SITE_OTHER): Payer: BC Managed Care – PPO | Admitting: Psychiatry

## 2019-05-14 DIAGNOSIS — F332 Major depressive disorder, recurrent severe without psychotic features: Secondary | ICD-10-CM

## 2019-05-14 NOTE — Progress Notes (Signed)
[facility] Counselor/Therapist Progress Note  Patient ID: James Morrow, MRN: [id number],    Date: 05/14/2019  Time Spent:  50 minutes   1:10 pm to 2:00  Treatment Type: Individual Therapy  Reported Symptoms: depression, some anxiety, decreased focus on job "about an hour a day, low motivation Mental Status Exam:  Appearance:   Casual     Behavior:  Sharing and some avoidance/lack of response  Motor:  Normal  Speech/Language:   Slow  Affect:  Blunt and Depressed  Mood:  depressed  Thought process:  goal directed  Thought content:    WNL  Sensory/Perceptual disturbances:    WNL  Orientation:  oriented to person, place, time/date, situation, day of week, month of year and year  Attention:  Fair  Concentration:  Fair  Memory:  [place] of knowledge:   Good  Insight:    Good/Fair  Judgment:   Good/Fair  Impulse Control:  Good   Risk Assessment: Danger to Self:  No Self-injurious Behavior: No Danger to Others: No Duty to Warn:no Physical Aggression / Violence:No  Access to Firearms a concern: No  Gang Involvement:No   Subjective: Patient reports    Interventions: Cognitive Behavioral Therapy and Solution-Oriented/Positive Psychology  Diagnosis:   ICD-10-CM   1. Severe episode of recurrent major depressive disorder, without psychotic features ([place])  F33.2      Plan: Patient not signing tx plan on computer screen due to Covid-19.  Treatment Goals: Goals remain on tx plan as patient works on strategies to achieve his goals. Progress is noted each session in "Progress" section of Plan. With some autism involved, patient has some specific need areas in addition to his anxiety and depression including difficulty speaking up, lack of goal for future and states he needs a long-term plan (job/finances), states he needs to "be applying more consistency to what he does each day" including having more of a schedule or routine, difficulty having/maintaining social  involvement and social connection, communication issues, distances himself from family and others, and difficulty maintaining a job however is working at this time from home.  Long Term Goal: Alleviate depressed mood and return to previous level of effective functioning. Patient will report no longer having suicidal thoughts for a period of at least 60/90 days.  Short Term Goal: Verbally express understanding of relationship between depressed mood and repression of feelings such as anger, hurt, and sadness.  Strategies: Identify and replace depressive thinking that leads to depressive feelings and actions.(Due to patient's current situation and status we are beginning with 1 strategy at this time and can add on as appropriate.)  Progress: Patient (with hx of being on autism specturm)  today reports today that he's not sure he interpreted a question from last session correctly and he did not but we picked up with what he states he had thought about since last appt.  Slow in responding but usually responds in conversation.  "I kinda don't think about too much. I really don't care about too much and haven't over the past year or two." States he began not caring as much when he took his current job. Best friends are Merrilee Seashore and Lilia Pro who live "[place] the country"" and they reportedly maintain frequent contact with each other.  Shares that he and dad are very different and right now he is not wanting a lot of contact. Responses tend to be slow again today but he does response versus sometimes remaining silent. Speaks about goals  we've projected but hard to take action.  Talked in more detail about his depression which he admits remains high, an 8-9 on 1-10 depression scale. Denies any SI and states that the elevated "depression is not that unusual for him sometimes." I mentioned a comment he had made last session about "needing a longterm plan" but he didn't offer any further comments nor information as  to his thoughts on that. Seems to be more" in the moment." Tried to offer some encouragement and connection for him but not sure of what he is invested to [employer] on. Goal review and progress in showing up for appt, and challenges noted with patient.  Next appt to be within 2-3 weeks of next avail after that, and is to be put on cancellation list to be seen sooner.   Mathis Fare, LCSW

## 2019-06-03 ENCOUNTER — Other Ambulatory Visit: Payer: Self-pay | Admitting: Psychiatry

## 2019-06-03 DIAGNOSIS — F332 Major depressive disorder, recurrent severe without psychotic features: Secondary | ICD-10-CM

## 2019-06-10 ENCOUNTER — Ambulatory Visit (INDEPENDENT_AMBULATORY_CARE_PROVIDER_SITE_OTHER): Payer: BC Managed Care – PPO | Admitting: Psychiatry

## 2019-06-10 ENCOUNTER — Encounter: Payer: Self-pay | Admitting: Psychiatry

## 2019-06-10 ENCOUNTER — Other Ambulatory Visit: Payer: Self-pay

## 2019-06-10 DIAGNOSIS — F840 Autistic disorder: Secondary | ICD-10-CM

## 2019-06-10 DIAGNOSIS — F411 Generalized anxiety disorder: Secondary | ICD-10-CM

## 2019-06-10 DIAGNOSIS — F4010 Social phobia, unspecified: Secondary | ICD-10-CM

## 2019-06-10 DIAGNOSIS — F332 Major depressive disorder, recurrent severe without psychotic features: Secondary | ICD-10-CM

## 2019-06-10 DIAGNOSIS — F322 Major depressive disorder, single episode, severe without psychotic features: Secondary | ICD-10-CM

## 2019-06-10 MED ORDER — DULOXETINE HCL 30 MG PO CPEP: 90.0000 mg | ORAL_CAPSULE | Freq: Every day | ORAL | 0 refills | Status: DC

## 2019-06-10 MED ORDER — BREXPIPRAZOLE 2 MG PO TABS: 2.0000 mg | ORAL_TABLET | Freq: Every day | ORAL | 1 refills | Status: DC

## 2019-06-10 NOTE — Progress Notes (Signed)
James Morrow [id number] [birth year] 29 y.o.  Subjective:   Patient ID:  James Morrow is a 29 y.o. (DOB [birth year]) male.  Chief Complaint:  Chief Complaint  Patient presents with  . Follow-up    Med changes  . Depression  . Anxiety     [name] presents to the office today for follow-up of depression with disability.  seen March 2020.  No meds were changed.  He continued sertraline 200 mg daily with Rexulti 2 mg daily.  He has missed appointments.  His parents have contacted the office with concerns about his withdrawal and lack of motivation and avoidant behaviors.  They are concerned he is missing some work.  seen December 29, 2018.  We reduce sertraline from 200 mg daily to 150 mg daily to reduce sexual side effects.  seen March 18, 2019.  He was more depressed.  We switched him from sertraline to duloxetine in hopes of improving the depression without worsening sexual side effects.  He continued the Rexulti until we could reevaluate the benefit of duloxetine.  Last seen March 2021.  The following was noted. Should be taking duloxetine 60 but he forgot to increase above 30 mg daily.  No problems with the tranistion. No problems nor benefit.  Other sx continue as noted.   Not hungry and not motivated both within and outside work.  Not sure when things got worse.  Flat.  Not a strong sense of sadness.  Energy a little down.  No weight change.  No change in anxiety.  A little worry over things to do.  Work from home with [employer] technically FT but not very busy.  Occ CO from boss.  Missing work at least once weekly. Sleep 6-8 hours. Plan increase duloxetine to 60 mg daily and continue Rexulti for now.  As of 06/10/19:  A bit better depression vs anxiety.  Dep 5/10.  Anxiety 4/10.  No change in appetite.  Weight stable.  Slightly better motivation.  Some interests.  Sleep 6-8 hours.  Energy pretty normal.  Work function a little better.  Would like better mood.    Started with  counselor Rockne Menghini.   Past Psychiatric Medication Trials: sertraline 200, Wellbutrin NR, duloxetine 60  At visit March 10, 2018 increased Rexulti to 2mg  daily.  Review of Systems:  Review of Systems  Constitutional: Positive for appetite change.  Neurological: Negative for dizziness, tremors and weakness.  Psychiatric/Behavioral: Positive for dysphoric mood. Negative for agitation, behavioral problems, confusion, decreased concentration, hallucinations, self-injury, sleep disturbance and suicidal ideas. The patient is not nervous/anxious and is not hyperactive.     Medications: I have reviewed the patient's current medications.  Current Outpatient Medications  Medication Sig Dispense Refill  . brexpiprazole (REXULTI) 2 MG TABS tablet Take 1 tablet (2 mg total) by mouth daily. 30 tablet 1  . DULoxetine (CYMBALTA) 30 MG capsule Take 3 capsules (90 mg total) by mouth daily. 270 capsule 0   No current facility-administered medications for this visit.    Medication Side Effects: None  Allergies: No Known Allergies  Past Medical History:  Diagnosis Date  . Anxiety disorder   . Depression     History reviewed. No pertinent family history.  Social History   Socioeconomic History  . Marital status: Single    Spouse name: Not on file  . Number of children: Not on file  . Years of education: Not on file  . Highest education level: Not on file  Occupational  History  . Not on file  Tobacco Use  . Smoking status: Never Smoker  . Smokeless tobacco: Never Used  Substance and Sexual Activity  . Alcohol use: Yes    Comment: social  . Drug use: No  . Sexual activity: Not on file  Other Topics Concern  . Not on file  Social History Narrative  . Not on file   Social Determinants of Health   Financial Resource Strain:   . Difficulty of Paying Living Expenses:   Food Insecurity:   . Worried About Charity fundraiser in the Last Year:   . Arboriculturist in the Last Year:    Transportation Needs:   . Film/video editor (Medical):   Marland Kitchen Lack of Transportation (Non-Medical):   Physical Activity:   . Days of Exercise per Week:   . Minutes of Exercise per Session:   Stress:   . Feeling of Stress :   Social Connections:   . Frequency of Communication with Friends and Family:   . Frequency of Social Gatherings with Friends and Family:   . Attends Religious Services:   . Active Member of Clubs or Organizations:   . Attends Archivist Meetings:   Marland Kitchen Marital Status:   Intimate Partner Violence:   . Fear of Current or Ex-Partner:   . Emotionally Abused:   Marland Kitchen Physically Abused:   . Sexually Abused:     Past Medical History, Surgical history, Social history, and Family history were reviewed and updated as appropriate.   Please see review of systems for further details on the patient's review from today.   Objective:   Physical Exam:  There were no vitals taken for this visit.  Physical Exam Constitutional:      General: He is not in acute distress.    Appearance: He is well-developed.  Neurological:     Mental Status: He is alert and oriented to person, place, and time.  Psychiatric:        Attention and Perception: He is attentive. He does not perceive auditory hallucinations.        Mood and Affect: Mood is depressed. Mood is not anxious. Affect is blunt. Affect is not labile, angry or inappropriate.        Speech: He is communicative.        Behavior: Behavior is slowed.        Thought Content: Thought content normal. Thought content is not paranoid. Thought content does not include homicidal or suicidal ideation. Thought content does not include homicidal or suicidal plan.        Cognition and Memory: Cognition normal.        Judgment: Judgment normal.     Comments: Insight and judgment fair.  Noticeable social skills deficits longterm. No auditory or visual hallucinations. No delusions.  Closed posture. hypoverbal per usual.      Lab Review:     Component Value Date/Time   NA 139 07/26/2015 1659   K 4.2 07/26/2015 1659   CL 103 07/26/2015 1659   CO2 30 07/26/2015 1659   GLUCOSE 96 07/26/2015 1659   BUN 21 (H) 07/26/2015 1659   CREATININE 1.21 07/26/2015 1659   CALCIUM 9.4 07/26/2015 1659   GFRNONAA >60 07/26/2015 1659   GFRAA >60 07/26/2015 1659       Component Value Date/Time   WBC 5.7 07/26/2015 1659   RBC 5.32 07/26/2015 1659   HGB 16.9 07/26/2015 1659   HCT 46.6 07/26/2015 1659  PLT 228 07/26/2015 1659   MCV 87.6 07/26/2015 1659   MCH 31.8 07/26/2015 1659   MCHC 36.3 (H) 07/26/2015 1659   RDW 12.7 07/26/2015 1659   LYMPHSABS 2.4 07/26/2015 1659   MONOABS 0.6 07/26/2015 1659   EOSABS 0.1 07/26/2015 1659   BASOSABS 0.1 07/26/2015 1659    No results found for: POCLITH, LITHIUM   No results found for: PHENYTOIN, PHENOBARB, VALPROATE, CBMZ   .res Assessment: Plan:    Severe episode of recurrent major depressive disorder, without psychotic features (HCC) - Plan: DULoxetine (CYMBALTA) 30 MG capsule  Social anxiety disorder  Generalized anxiety disorder  Autism spectrum disorder  Severe major depression without psychotic features (HCC) - Plan: brexpiprazole (REXULTI) 2 MG TABS tablet  Also Asberger's disorder  Vannie has had severe major depression and severe anxiety disorder that has interfered with his ability to function.  This is complicated by underlying mild to moderate autism features but without cognitive deficits.  More recently he describes himself as "stuck" with low motivation and poor productivity both at work and outside of work.  He was noted to have depressive symptoms at his last visit and sertraline was changed to duloxetine.  Partial response   Worked on insight into how sx affect his QOL.  "Trapped in a cycle of basically doing noting"     He is back to working full-time without restrictions.  Discussed potential metabolic side effects associated with atypical  antipsychotics, as well as potential risk for movement side effects. Advised pt to contact office if movement side effects occur.   Increase duloxetine to 90 mg daily.  Disc sweatiing.  Continue Rexulti for now.  If the duloxetine is markedly helpful then we may discontinue it.  Return to counseling.  He agrees.  Referred to Rockne Menghini.  And he has started counseling with her.  Discussed case with her today.  Fu 6 weeks  Meredith Staggers, MD, DFAPA   Please see After Visit Summary for patient specific instructions.  Future Appointments  Date Time Provider Department Center  06/25/2019  1:00 PM Mathis Fare, LCSW [facility] None    No orders of the defined types were placed in this encounter.     -------------------------------

## 2019-06-17 ENCOUNTER — Other Ambulatory Visit: Payer: Self-pay

## 2019-06-17 DIAGNOSIS — F332 Major depressive disorder, recurrent severe without psychotic features: Secondary | ICD-10-CM

## 2019-06-17 MED ORDER — DULOXETINE HCL 30 MG PO CPEP: ORAL_CAPSULE | ORAL | 0 refills | Status: DC

## 2019-06-17 MED ORDER — DULOXETINE HCL 60 MG PO CPEP: ORAL_CAPSULE | ORAL | 0 refills | Status: DC

## 2019-06-25 ENCOUNTER — Other Ambulatory Visit: Payer: Self-pay

## 2019-06-25 ENCOUNTER — Ambulatory Visit (INDEPENDENT_AMBULATORY_CARE_PROVIDER_SITE_OTHER): Payer: BC Managed Care – PPO | Admitting: Psychiatry

## 2019-06-25 DIAGNOSIS — F332 Major depressive disorder, recurrent severe without psychotic features: Secondary | ICD-10-CM | POA: Diagnosis not present

## 2019-06-25 NOTE — Progress Notes (Signed)
[facility] Counselor/Therapist Progress Note  Patient ID: James Morrow, MRN: [id number],    59  06/25/2019  Time Spent:  60 minutes  1:00pm to 2:00pm  Treatment Type: Individual Therapy  Reported Symptoms: difficulty working his full work schedule due to "lack of interest", anxiety, depression  Mental Status Exam:  Appearance:   Casual     Behavior:  Sharing  Motor:  Normal  Speech/Language:   Normal Rate  Affect:  Blunt and Depressed  Mood:  anxious  Thought process:  difficulty assessing due to lack of verbal expression; WNL  Thought content:    normal  Sensory/Perceptual disturbances:    WNL  Orientation:  oriented to person, place, time/date, situation, day of week, month of year and year  Attention:  Fair/Good  Concentration:  Good/Fair  Memory:  WNL  Fund of knowledge:   Good  Insight:    Fair/Good  Judgment:   Fair  Impulse Control:  Good   Risk Assessment: Danger to Self:  No Self-injurious Behavior: No Danger to Others: No Duty to Warn:no Physical Aggression / Violence:No  Access to Firearms a concern: No  Gang Involvement:No   Subjective: Patient in today with anxiety and depression. Difficulty engaging in conversation despite efforts by therapist to engage him.   Interventions: Solution-Oriented/Positive Psychology and Ego-Supportive  Diagnosis:   ICD-10-CM   1. Severe episode of recurrent major depressive disorder, without psychotic features (HCC)  F33.2     Plan: Patient not signing tx plan on computer screen due to Covid-19.  Treatment Goals: Goals remain on tx plan as patient works on strategies to achieve his goals. Progress is noted each session in "Progress" section of Plan.With some autism involved, patient has some specific need areas in addition to his anxiety and depression including difficulty speaking up, lack of goal for future and states he needs a long-term plan (job/finances), states he needs to "be applying more  consistency to what he does each day" including having more of a schedule or routine, difficulty having/maintaining social involvement and social connection, communication issues, distances himself from family and others, and difficulty maintaining a job however is working at this time from home.  Long Term Goal: Alleviate depressed mood and return to previous level of effective functioning. Patient will report no longer having suicidal thoughts for a period of at least 60/90 days.  Short Term Goal: Verbally express understanding of relationship between depressed mood and repression of feelings such as anger, hurt, and sadness.  Strategies: Identify and replace depressive thinking that leads to depressive feelings and actions.(Due to patient's current situation and status we are beginning with 1 strategy at this time and can add on as appropriate.)  Progress: Patient in today and difficult to engage initially.  After a period of time, he became more interactive and part of our conversation.  States he really doesn't care about anything but denies any SI.  Adds that the last time he cared about anything was in mid 2019 and he cared about some of the people he played games with, his job and some of his coworkers.  Family is concerned about him and his lack of interest, low motivation, missing work, and isolation. Patient denies any SI.  What I did find today is that he responds some better to questions when given multiple choice answers versus open-ended questions. Still difficult to get much more than short answers. Dad wanting closer relationship with patient and family but patient is not desiring  to work on those relationship issues at this current time. His repsonses are still slow at times and sometimes remains silent. States he has always been depressed and does not show much concern about that. I reminded him about an earlier comment he had made of "needing a long term plan as far as work and  supporting himself" but he does not seem to recall that very clearly but acknowledges "that's a good idea", yet doesn't go further in conversation about it.  Will continue to work towards strengthening his trust and willingness to be more goal-directed.  Goal review and challeges/efforts/progress in meeting appt noted with patient.  Next appt is between 2-3 weeks.   Shanon Ace, LCSW

## 2019-07-21 ENCOUNTER — Ambulatory Visit: Payer: BC Managed Care – PPO | Admitting: Mental Health

## 2019-07-21 ENCOUNTER — Other Ambulatory Visit: Payer: Self-pay

## 2019-07-21 NOTE — Progress Notes (Unsigned)
Error

## 2019-07-22 ENCOUNTER — Ambulatory Visit (INDEPENDENT_AMBULATORY_CARE_PROVIDER_SITE_OTHER): Payer: BC Managed Care – PPO | Admitting: Psychiatry

## 2019-07-22 ENCOUNTER — Encounter: Payer: Self-pay | Admitting: Psychiatry

## 2019-07-22 DIAGNOSIS — F4010 Social phobia, unspecified: Secondary | ICD-10-CM

## 2019-07-22 DIAGNOSIS — F840 Autistic disorder: Secondary | ICD-10-CM | POA: Diagnosis not present

## 2019-07-22 DIAGNOSIS — F411 Generalized anxiety disorder: Secondary | ICD-10-CM

## 2019-07-22 DIAGNOSIS — F332 Major depressive disorder, recurrent severe without psychotic features: Secondary | ICD-10-CM

## 2019-07-22 NOTE — Progress Notes (Signed)
James Morrow [id number] September 16, 1990 29 y.o.  Subjective:   Patient ID:  James Morrow is a 29 y.o. (DOB 15-Aug-1990) male.  Chief Complaint:  Chief Complaint  Patient presents with  . Follow-up  . Depression  . Anxiety     James Morrow presents to the office today for follow-up of depression with disability.  seen March 2020.  No meds were changed.  He continued sertraline 200 mg daily with Rexulti 2 mg daily.  He has missed appointments.  His parents have contacted the office with concerns about his withdrawal and lack of motivation and avoidant behaviors.  They are concerned he is missing some work.  seen December 29, 2018.  We reduce sertraline from 200 mg daily to 150 mg daily to reduce sexual side effects.  seen March 18, 2019.  He was more depressed.  We switched him from sertraline to duloxetine in hopes of improving the depression without worsening sexual side effects.  He continued the Rexulti until we could reevaluate the benefit of duloxetine.  Last seen March 2021.  The following was noted. Should be taking duloxetine 60 but he forgot to increase above 30 mg daily.  No problems with the tranistion. No problems nor benefit.  Other sx continue as noted.   Not hungry and not motivated both within and outside work.  Not sure when things got worse.  Flat.  Not a strong sense of sadness.  Energy a little down.  No weight change.  No change in anxiety.  A little worry over things to do.  Work from home with [employer] technically FT but not very busy.  Occ CO from boss.  Missing work at least once weekly. Sleep 6-8 hours. Plan increase duloxetine to 60 mg daily and continue Rexulti for now.  As of 06/10/19:  A bit better depression vs anxiety.  Dep 5/10.  Anxiety 4/10.  No change in appetite.  Weight stable.  Slightly better motivation.  Some interests.  Sleep 6-8 hours.  Energy pretty normal.  Work function a little better.  Would like better mood.   Plan: Increase duloxetine to 90 mg  daily.  Disc sweatiing. Continue Rexulti for now.    07/22/2019 appointment with the following noted:  Definitely noted improvement with increase duloxetine to 90 for depression.   He stopped Rexulti DT cost about a month ago. CO muscle fatigue in certain places like mouth and R foot.  No tremor or twitching. Overall energy is OK but getting tired earlier than normal in evening.    Not bad anxiety.  Sleep normal. Work is still hard but check in more than he had been.  If has something to do then can focus and do the work.  Started with counselor James Morrow.   Past Psychiatric Medication Trials: sertraline 200, Wellbutrin NR, duloxetine 90  At visit March 10, 2018 increased Rexulti to 2mg  daily & DC 05/2019 DT cost.  Review of Systems:  Review of Systems  Constitutional: Positive for appetite change.  Neurological: Negative for dizziness, tremors and weakness.  Psychiatric/Behavioral: Positive for dysphoric mood. Negative for agitation, behavioral problems, confusion, decreased concentration, hallucinations, self-injury, sleep disturbance and suicidal ideas. The patient is not nervous/anxious and is not hyperactive.     Medications: I have reviewed the patient's current medications.  Current Outpatient Medications  Medication Sig Dispense Refill  . DULoxetine (CYMBALTA) 30 MG capsule Take 1 capsule (30 mg) by mouth daily with 1 (60 mg) capsule to equal 90 mg  90 capsule 0  . DULoxetine (CYMBALTA) 60 MG capsule Take 1 capsule (60 mg) by mouth daily with 1 (30 mg) capsule for total of 90 mg 90 capsule 0   No current facility-administered medications for this visit.    Medication Side Effects: None  Allergies: No Known Allergies  Past Medical History:  Diagnosis Date  . Anxiety disorder   . Depression     History reviewed. No pertinent family history.  Social History   Socioeconomic History  . Marital status: Single    Spouse name: Not on file  . Number of children: Not  on file  . Years of education: Not on file  . Highest education level: Not on file  Occupational History  . Not on file  Tobacco Use  . Smoking status: Never Smoker  . Smokeless tobacco: Never Used  Substance and Sexual Activity  . Alcohol use: Yes    Comment: social  . Drug use: No  . Sexual activity: Not on file  Other Topics Concern  . Not on file  Social History Narrative  . Not on file   Social Determinants of Health   Financial Resource Strain:   . Difficulty of Paying Living Expenses:   Food Insecurity:   . Worried About Charity fundraiser in the Last Year:   . Arboriculturist in the Last Year:   Transportation Needs:   . Film/video editor (Medical):   Marland Kitchen Lack of Transportation (Non-Medical):   Physical Activity:   . Days of Exercise per Week:   . Minutes of Exercise per Session:   Stress:   . Feeling of Stress :   Social Connections:   . Frequency of Communication with Friends and Family:   . Frequency of Social Gatherings with Friends and Family:   . Attends Religious Services:   . Active Member of Clubs or Organizations:   . Attends Archivist Meetings:   Marland Kitchen Marital Status:   Intimate Partner Violence:   . Fear of Current or Ex-Partner:   . Emotionally Abused:   Marland Kitchen Physically Abused:   . Sexually Abused:     Past Medical History, Surgical history, Social history, and Family history were reviewed and updated as appropriate.   Please see review of systems for further details on the patient's review from today.   Objective:   Physical Exam:  There were no vitals taken for this visit.  Physical Exam Constitutional:      General: He is not in acute distress.    Appearance: He is well-developed.  Neurological:     Mental Status: He is alert and oriented to person, place, and time.  Psychiatric:        Attention and Perception: He is attentive. He does not perceive auditory hallucinations.        Mood and Affect: Mood is anxious and  depressed. Affect is blunt. Affect is not labile, angry or inappropriate.        Speech: He is communicative.        Behavior: Behavior is not slowed.        Thought Content: Thought content normal. Thought content is not paranoid. Thought content does not include homicidal or suicidal ideation. Thought content does not include homicidal or suicidal plan.        Cognition and Memory: Cognition normal.        Judgment: Judgment normal.     Comments: Insight and judgment fair.  Noticeable social skills deficits  longterm. No auditory or visual hallucinations. No delusions.  Closed posture. hypoverbal per usual but more smiling noted. depression and anxiety are both improved but not gone       Lab Review:     Component Value Date/Time   NA 139 07/26/2015 1659   K 4.2 07/26/2015 1659   CL 103 07/26/2015 1659   CO2 30 07/26/2015 1659   GLUCOSE 96 07/26/2015 1659   BUN 21 (H) 07/26/2015 1659   CREATININE 1.21 07/26/2015 1659   CALCIUM 9.4 07/26/2015 1659   GFRNONAA >60 07/26/2015 1659   GFRAA >60 07/26/2015 1659       Component Value Date/Time   WBC 5.7 07/26/2015 1659   RBC 5.32 07/26/2015 1659   HGB 16.9 07/26/2015 1659   HCT 46.6 07/26/2015 1659   PLT 228 07/26/2015 1659   MCV 87.6 07/26/2015 1659   MCH 31.8 07/26/2015 1659   MCHC 36.3 (H) 07/26/2015 1659   RDW 12.7 07/26/2015 1659   LYMPHSABS 2.4 07/26/2015 1659   MONOABS 0.6 07/26/2015 1659   EOSABS 0.1 07/26/2015 1659   BASOSABS 0.1 07/26/2015 1659    No results found for: POCLITH, LITHIUM   No results found for: PHENYTOIN, PHENOBARB, VALPROATE, CBMZ   .res Assessment: Plan:    Severe episode of recurrent major depressive disorder, without psychotic features (HCC)  Social anxiety disorder  Generalized anxiety disorder  Autism spectrum disorder  Also Asberger's disorder  James Morrow has had severe major depression and severe anxiety disorder that has interfered with his ability to function.  This is complicated  by underlying mild to moderate autism features but without cognitive deficits.  More recently he describes himself as "stuck" with low motivation and poor productivity both at work and outside of work.   Better mood with switch from sertraline to duloxetine 90.  Still has residual symptoms but he is satisfied with the current dose at this time. Continue but split dose of duloxetine  90 mg daily.  Disc side effects  He is back to working full-time without restrictions.  DC Rexult DT cost  Return to counseling.  He agrees.  Referred to James Morrow.  And he has started counseling with her.  Discussed case with her today.   Fu 8 weeks  Meredith Staggers, MD, DFAPA   Please see After Visit Summary for patient specific instructions.  Future Appointments  Date Time Provider Department Center  09/03/2019  4:00 PM Mathis Fare, LCSW [facility] None    No orders of the defined types were placed in this encounter.     -------------------------------

## 2019-09-03 ENCOUNTER — Ambulatory Visit (INDEPENDENT_AMBULATORY_CARE_PROVIDER_SITE_OTHER): Payer: BC Managed Care – PPO | Admitting: Psychiatry

## 2019-09-03 ENCOUNTER — Other Ambulatory Visit: Payer: Self-pay

## 2019-09-03 DIAGNOSIS — F332 Major depressive disorder, recurrent severe without psychotic features: Secondary | ICD-10-CM | POA: Diagnosis not present

## 2019-09-03 NOTE — Progress Notes (Signed)
[facility] Counselor/Therapist Progress Note  Patient ID: James Morrow, MRN: [id number],    Date: 09/03/2019  Time Spent: 60 minutes   4:00pm to 5:00pm   Treatment Type: Individual Therapy  Reported Symptoms: anxiety, lack on interest in things, unmotivated  Mental Status Exam:  Appearance:   Casual     Behavior:  Sharing  Motor:  Normal  Speech/Language:   a little slower initially but got more WNL  Affect:  Flat  Mood:  depressed and reports some anxiety also  Thought process:  with help, was able to become more goal-oriented  Thought content:    slowed but WNL  Sensory/Perceptual disturbances:    WNL  Orientation:  oriented to person, place, time/date, situation, day of week, month of year and year  Attention:  Fair  Concentration:  Fair  Memory:  "sometimes forgetful"  Fund of knowledge:   Good  Insight:    Fair  Judgment:   Fair  Impulse Control:  Fair   Risk Assessment: Danger to Self:  No Self-injurious Behavior: No Danger to Others: No Duty to Warn:no Physical Aggression / Violence:No  Access to Firearms a concern: No  Gang Involvement:No   Subjective: Patient today reports anxiety, lack of interest in things, and unmotivated. "amily/Dad wanting him to get help."  He eventually comments that he knows he "may need to do some things differently."  Interventions: Solution-Oriented/Positive Psychology  Diagnosis:   ICD-10-CM   1. Severe episode of recurrent major depressive disorder, without psychotic features (HCC)  F33.2      Plan: Patient not signing tx plan on computer screen due to Covid-19.  Treatment Goals: Goals remain on tx plan as patient works on strategies to achieve his goals. Progress is noted each session in "Progress" section of Plan.With some autism involved, patient has some specific need areas in addition to his anxiety and depression including difficulty speaking up, lack of goal for future and states he needs a long-term plan  (job/finances), states he needs to "be applying more consistency to what he does each day" including having more of a schedule or routine, difficulty having/maintaining social involvement and social connection, communication issues, distances himself from family and others, and difficulty maintaining a job however is working at this time from home.  Long Term Goal: Alleviate depressed mood and return to previous level of effective functioning. Patient will report no longer having suicidal thoughts for a period of at least 60/90 days.  Short Term Goal: Verbally express understanding of relationship between depressed mood and repression of feelings such as anger, hurt, and sadness.  Strategies: Identify and replace depressive thinking that leads to depressive feelings and actions.(Due to patient's current situation and status we are beginning with 1 strategy at this time and can add on as appropriate.)  Progress: Patient in today and reports anxiety and procrastination. Not very motivated. Family is quite concerned about him and his lack of closeness to others and lack of responsibility,some of which is related to autistic traits. Difficult to stay in conversation but eventually talked more freely.  Did say he wanted to stop procrastinating and shared his views on this.  Between sessions he is to work on not procrastinating by doing just the opposite by handling tasks/situations as they arise. He is to focus on assigned work tasks and tasks at home and will keep a record both place as to "tasks to be done" and "tasks completed" so as to track his own progress or  lack of progress. Did become more interactive today and showed some interest in changes to be made.  States "I tend to procrastinate and that makes me anxious, I guess I'm in a phase of wandering aimlessly and my job sucks, but I don't seem to care to change."  Asked him about any fear of change and after pausing, he responded "yes".  However  he does commit to working on the above changes and took responsibility for scheduling his next appt.  Will pick up on fears of change next session.   Goal review and progress noted with patient.  Next appt within 3 weeks.   Mathis Fare, LCSW

## 2019-09-23 ENCOUNTER — Other Ambulatory Visit: Payer: Self-pay

## 2019-09-23 ENCOUNTER — Ambulatory Visit (INDEPENDENT_AMBULATORY_CARE_PROVIDER_SITE_OTHER): Payer: BC Managed Care – PPO | Admitting: Psychiatry

## 2019-09-23 ENCOUNTER — Encounter: Payer: Self-pay | Admitting: Psychiatry

## 2019-09-23 DIAGNOSIS — F840 Autistic disorder: Secondary | ICD-10-CM

## 2019-09-23 DIAGNOSIS — F411 Generalized anxiety disorder: Secondary | ICD-10-CM | POA: Diagnosis not present

## 2019-09-23 DIAGNOSIS — F4010 Social phobia, unspecified: Secondary | ICD-10-CM

## 2019-09-23 DIAGNOSIS — F332 Major depressive disorder, recurrent severe without psychotic features: Secondary | ICD-10-CM

## 2019-09-23 MED ORDER — DULOXETINE HCL 60 MG PO CPEP: ORAL_CAPSULE | ORAL | 1 refills | Status: AC

## 2019-09-23 NOTE — Progress Notes (Signed)
James Morrow [id number] [birth year] 29 y.o.  Subjective:   Patient ID:  James Morrow is a 29 y.o. (DOB May 01, 1990) male.  Chief Complaint:  Chief Complaint  Patient presents with  . Follow-up  . Depression  . Anxiety     James Morrow presents to the office today for follow-up of depression with disability.  seen March 2020.  No meds were changed.  He continued sertraline 200 mg daily with Rexulti 2 mg daily.  He has missed appointments.  His parents have contacted the office with concerns about his withdrawal and lack of motivation and avoidant behaviors.  They are concerned he is missing some work.  seen December 29, 2018.  We reduce sertraline from 200 mg daily to 150 mg daily to reduce sexual side effects.  seen March 18, 2019.  He was more depressed.  We switched him from sertraline to duloxetine in hopes of improving the depression without worsening sexual side effects.  He continued the Rexulti until we could reevaluate the benefit of duloxetine.  seen March 2021.  The following was noted. Should be taking duloxetine 60 but he forgot to increase above 30 mg daily.  No problems with the tranistion. No problems nor benefit.  Other sx continue as noted.   Not hungry and not motivated both within and outside work.  Not sure when things got worse.  Flat.  Not a strong sense of sadness.  Energy a little down.  No weight change.  No change in anxiety.  A little worry over things to do.  Work from home with [employer] technically FT but not very busy.  Occ CO from boss.  Missing work at least once weekly. Sleep 6-8 hours. Plan increase duloxetine to 60 mg daily and continue Rexulti for now.  As of 06/10/19:  A bit better depression vs anxiety.  Dep 5/10.  Anxiety 4/10.  No change in appetite.  Weight stable.  Slightly better motivation.  Some interests.  Sleep 6-8 hours.  Energy pretty normal.  Work function a little better.  Would like better mood.   Plan: Increase duloxetine to 90 mg  daily.  Disc sweatiing. Continue Rexulti for now.    07/22/2019 appointment with the following noted:  Definitely noted improvement with increase duloxetine to 90 for depression.   He stopped Rexulti DT cost about a month ago. CO muscle fatigue in certain places like mouth and R foot.  No tremor or twitching. Overall energy is OK but getting tired earlier than normal in evening.    Not bad anxiety.  Sleep normal. Work is still hard but check in more than he had been.  If has something to do then can focus and do the work. Plan no med change  09/23/2019 appointment with the following noted: He dropped duloxetine to 60 bc convenience and doesn't feel much different.  Overall feels pretty good with mood.   Gets some anxiety but less than in the past.   Not much interference. Dep 2/10. Sleep normal. SE odd taste.   Work is a little better but still a little hard.    Started with counselor Rockne Menghini and going OK now.  Getting somewhere.  Past Psychiatric Medication Trials: sertraline 200, Wellbutrin NR, duloxetine 90  At visit March 10, 2018 increased Rexulti to 2mg  daily & DC 05/2019 DT cost.  Review of Systems:  Review of Systems  Constitutional: Positive for appetite change.  Neurological: Negative for dizziness, tremors and weakness.  Psychiatric/Behavioral: Positive  for dysphoric mood. Negative for agitation, behavioral problems, confusion, decreased concentration, hallucinations, self-injury, sleep disturbance and suicidal ideas. The patient is not nervous/anxious and is not hyperactive.     Medications: I have reviewed the patient's current medications.  Current Outpatient Medications  Medication Sig Dispense Refill  . DULoxetine (CYMBALTA) 30 MG capsule Take 1 capsule (30 mg) by mouth daily with 1 (60 mg) capsule to equal 90 mg 90 capsule 0  . DULoxetine (CYMBALTA) 60 MG capsule Take 1 capsule (60 mg) by mouth daily with 1 (30 mg) capsule for total of 90 mg 90 capsule 0    No current facility-administered medications for this visit.    Medication Side Effects: None  Allergies: No Known Allergies  Past Medical History:  Diagnosis Date  . Anxiety disorder   . Depression     History reviewed. No pertinent family history.  Social History   Socioeconomic History  . Marital status: Single    Spouse name: Not on file  . Number of children: Not on file  . Years of education: Not on file  . Highest education level: Not on file  Occupational History  . Not on file  Tobacco Use  . Smoking status: Never Smoker  . Smokeless tobacco: Never Used  Substance and Sexual Activity  . Alcohol use: Yes    Comment: social  . Drug use: No  . Sexual activity: Not on file  Other Topics Concern  . Not on file  Social History Narrative  . Not on file   Social Determinants of Health   Financial Resource Strain:   . Difficulty of Paying Living Expenses:   Food Insecurity:   . Worried About Programme researcher, broadcasting/film/video in the Last Year:   . Barista in the Last Year:   Transportation Needs:   . Freight forwarder (Medical):   Marland Kitchen Lack of Transportation (Non-Medical):   Physical Activity:   . Days of Exercise per Week:   . Minutes of Exercise per Session:   Stress:   . Feeling of Stress :   Social Connections:   . Frequency of Communication with Friends and Family:   . Frequency of Social Gatherings with Friends and Family:   . Attends Religious Services:   . Active Member of Clubs or Organizations:   . Attends Banker Meetings:   Marland Kitchen Marital Status:   Intimate Partner Violence:   . Fear of Current or Ex-Partner:   . Emotionally Abused:   Marland Kitchen Physically Abused:   . Sexually Abused:     Past Medical History, Surgical history, Social history, and Family history were reviewed and updated as appropriate.   Please see review of systems for further details on the patient's review from today.   Objective:   Physical Exam:  There were no  vitals taken for this visit.  Physical Exam Constitutional:      General: He is not in acute distress.    Appearance: He is well-developed.  Neurological:     Mental Status: He is alert and oriented to person, place, and time.  Psychiatric:        Attention and Perception: He is attentive. He does not perceive auditory hallucinations.        Mood and Affect: Mood is anxious and depressed. Affect is blunt. Affect is not labile, angry or inappropriate.        Speech: He is communicative.        Behavior: Behavior is not  slowed.        Thought Content: Thought content normal. Thought content is not paranoid. Thought content does not include homicidal or suicidal ideation. Thought content does not include homicidal or suicidal plan.        Cognition and Memory: Cognition normal.        Judgment: Judgment normal.     Comments: Insight and judgment fair.  Noticeable social skills deficits longterm. No auditory or visual hallucinations. No delusions.  Closed posture. hypoverbal per usual but more smiling noted. depression and anxiety are both improved but not gone       Lab Review:     Component Value Date/Time   NA 139 07/26/2015 1659   K 4.2 07/26/2015 1659   CL 103 07/26/2015 1659   CO2 30 07/26/2015 1659   GLUCOSE 96 07/26/2015 1659   BUN 21 (H) 07/26/2015 1659   CREATININE 1.21 07/26/2015 1659   CALCIUM 9.4 07/26/2015 1659   GFRNONAA >60 07/26/2015 1659   GFRAA >60 07/26/2015 1659       Component Value Date/Time   WBC 5.7 07/26/2015 1659   RBC 5.32 07/26/2015 1659   HGB 16.9 07/26/2015 1659   HCT 46.6 07/26/2015 1659   PLT 228 07/26/2015 1659   MCV 87.6 07/26/2015 1659   MCH 31.8 07/26/2015 1659   MCHC 36.3 (H) 07/26/2015 1659   RDW 12.7 07/26/2015 1659   LYMPHSABS 2.4 07/26/2015 1659   MONOABS 0.6 07/26/2015 1659   EOSABS 0.1 07/26/2015 1659   BASOSABS 0.1 07/26/2015 1659    No results found for: POCLITH, LITHIUM   No results found for: PHENYTOIN, PHENOBARB,  VALPROATE, CBMZ   .res Assessment: Plan:    Severe episode of recurrent major depressive disorder, without psychotic features (HCC)  Social anxiety disorder  Generalized anxiety disorder  Autism spectrum disorder  Also Asberger's disorder  James Morrow has had severe major depression and severe anxiety disorder that has interfered with his ability to function.  This is complicated by underlying mild to moderate autism features but without cognitive deficits.  More recently he describes himself as "stuck" with low motivation and poor productivity both at work and outside of work.   Better mood with switch from sertraline to duloxetine 90.  Still has residual symptoms but he is satisfied with the current dose at this time.  Continue but split dose of duloxetine  60 mg daily.  Disc side effects  Return to counseling.  He agrees.  Referred to Rockne Menghini.  And he has started counseling with her.  Discussed case with her today.  Fu 5 mos  Meredith Staggers, MD, DFAPA   Please see After Visit Summary for patient specific instructions.  Future Appointments  Date Time Provider Department Center  09/30/2019  5:00 PM Mathis Fare, LCSW [facility] None  10/22/2019  1:00 PM Mathis Fare, LCSW [facility] None  11/12/2019  3:00 PM Mathis Fare, LCSW [facility] None    No orders of the defined types were placed in this encounter.     -------------------------------

## 2019-09-30 ENCOUNTER — Ambulatory Visit (INDEPENDENT_AMBULATORY_CARE_PROVIDER_SITE_OTHER): Payer: BC Managed Care – PPO | Admitting: Psychiatry

## 2019-09-30 ENCOUNTER — Other Ambulatory Visit: Payer: Self-pay

## 2019-09-30 DIAGNOSIS — F332 Major depressive disorder, recurrent severe without psychotic features: Secondary | ICD-10-CM | POA: Diagnosis not present

## 2019-09-30 NOTE — Progress Notes (Signed)
[facility] Counselor/Therapist Progress Note  Patient ID: James Morrow, MRN: [id number],    Date: 09/30/2019  Time Spent: 60 minutes   5:00pm to 6:00pm  Treatment Type: Individual Therapy  Reported Symptoms: anxiety, depression (but "less depression)  Mental Status Exam:  Appearance:   Casual     Behavior:  Sharing  Motor:  Normal  Speech/Language:   slower  Affect:  Flat  Mood:  anxious  Thought process:  slowed, but able to be goal-directed for brief periods  Thought content:    WNL  Sensory/Perceptual disturbances:    WNL  Orientation:  oriented to person, place, time/date, situation, day of week, month of year and year  Attention:  Fair  Concentration:  Fair  Memory:  WNL  Fund of knowledge:   Good  Insight:    Fair  Judgment:   Fair  Impulse Control:  Good   Risk Assessment: Danger to Self:  No Self-injurious Behavior: No Danger to Others: No Duty to Warn:no Physical Aggression / Violence:No  Access to Firearms a concern: No  Gang Involvement:No   Subjective: Patient today reports anxiety, some depression (which has lessened some per patient).     Interventions: Cognitive Behavioral Therapy and Solution-Oriented/Positive Psychology  Diagnosis:   ICD-10-CM   1. Severe episode of recurrent major depressive disorder, without psychotic features (HCC)  F33.2      Plan: Patient not signing tx plan on computer screen due to Covid-19.  Treatment Goals: Goals remain on tx plan as patient works on strategies to achieve his goals. Progress is noted each session in "Progress" section of Plan.With some autism involved, patient has some specific need areas in addition to his anxiety and depression including difficulty speaking up, lack of goal for future and states he needs a long-term plan (job/finances), states he needs to "be applying more consistency to what he does each day" including having more of a schedule or routine, difficulty having/maintaining  social involvement and social connection, communication issues, distances himself from family and others, and difficulty maintaining a job however is working at this time from home.  Long Term Goal: Alleviate depressed mood and return to previous level of effective functioning. Patient will report no longer having suicidal thoughts for a period of at least 60/90 days.  Short Term Goal: Verbally express understanding of relationship between depressed mood and repression of feelings such as anger, hurt, and sadness.  Strategies: Identify and replace depressive thinking that leads to depressive feelings and actions.(Due to patient's current situation and status we are beginning with 1 strategy at this time and can add on as appropriate.)  Progress: Patient today reporting some decrease in depression, but anxiety continues along with difficulty feeling motivated for change. Some reluctance in talking but this seems related to his autistic features and in part lack of motivation. Discussed his motivation concerns which he says began past 3-4 yrs but not sure how it started. Has not been back to gym in 18 months and recently went back.recently with plan of going and working out 2 days per week, hoping to build back up to his prior schedule of 4 days per week. . Talked about how going to the gym previously did help him with motivation and  "probably myself esteem also", and I encouraged him in that he could regain some of the muscle tone and physical fitness he had back when he was more regularly involved at gym, as this seemed to be a "positive" for  patient. Family remains concerned about patient and his future but patient is still wanting his appointments to be just for him, and not have other family member involved at this time.  Encouraged patient in his renewed interest in working out at the gym, to remain on his meds as prescribed, getting outside more each day, and finding opportunities to be  around other people more even for shorter periods of time rather than be so isolated, to try getting a little more of his assigned work done for his job, and to work on more positive self talk.  Noticed that patient actually smiled briefly today at one point which does not happen often from what I have observed.   Goal review and progress/challenges noted with patient.  Next appointment within 3 weeks.   Mathis Fare, LCSW

## 2019-10-22 ENCOUNTER — Other Ambulatory Visit: Payer: Self-pay

## 2019-10-22 ENCOUNTER — Ambulatory Visit (INDEPENDENT_AMBULATORY_CARE_PROVIDER_SITE_OTHER): Payer: BC Managed Care – PPO | Admitting: Psychiatry

## 2019-10-22 DIAGNOSIS — F332 Major depressive disorder, recurrent severe without psychotic features: Secondary | ICD-10-CM

## 2019-10-22 NOTE — Progress Notes (Signed)
[facility] Counselor/Therapist Progress Note  Patient ID: James Morrow, MRN: [id number],    Date: 10/22/2019  Time Spent: 30 minutes  1:00pm to 1:30pm  Treatment Type: Individual Therapy  Reported Symptoms:  "some anxiety, depression less"  Mental Status Exam:  Appearance:   Casual     Behavior:  Sharing  Motor:  Normal  Speech/Language:   Clear and Coherent  Affect:  reports feeling anxious but affect is flat  Mood:  anxious and some depression  Thought process:  goal directed; hard to evaluate due to not talking much today  Thought content:    WNL, hard to assess due to talking less  Sensory/Perceptual disturbances:    WNL  Orientation:  oriented to person, place, time/date, situation, day of week, month of year and year  Attention:  Fair  Concentration:  Fair  Memory:  [place] of knowledge:   Good  Insight:    Fair  Judgment:   Fair  Impulse Control:  Good and Fair   Risk Assessment: Danger to Self:  No Self-injurious Behavior: No Danger to Others: No Duty to Warn:no Physical Aggression / Violence:No Access to Firearms a concern: No  Gang Involvement:No   Subjective: Patient today reports anxiety, depression decreased some. Typically spending lots of time to himself but states recently he's gotten out more with others although does not describe the nature of those interactions.   Interventions: Solution-Oriented/Positive Psychology and Ego-Supportive  Diagnosis:   ICD-10-CM   1. Severe episode of recurrent major depressive disorder, without psychotic features ([place])  F33.2      Plan: Patient not signing tx plan on computer screen due to Covid-19.  Treatment Goals: Goals remain on tx plan as patient works on strategies to achieve his goals. Progress is noted each session in "Progress" section of Plan.With some autism involved, patient has some specific need areas in addition to his anxiety and depression including difficulty speaking up, lack of  goal for future and states he needs a long-term plan (job/finances), states he needs to "be applying more consistency to what he does each day" including having more of a schedule or routine, difficulty having/maintaining social involvement and social connection, communication issues, distances himself from family and others, and difficulty maintaining a job however is working at this time from home.  Long Term Goal: Alleviate depressed mood and return to previous level of effective functioning. Patient will report no longer having suicidal thoughts for a period of at least 60/90 days.  Short Term Goal: Verbally express understanding of relationship between depressed mood and repression of feelings such as anger, hurt, and sadness.  Strategies: Identify and replace depressive thinking that leads to depressive feelings and actions.(Due to patient's current situation and status we are beginning with 1 strategy at this time and can add on as appropriate.)  Progress: Patient today reporting anxiety is his main symptom, with depression being less. More difficulty talking today. Hard to better assess his thoughts.  He is more reserved and quiet, but calm.  Responded to questions in short answers.  When asked what he would like to talk about today or felt would be helpful, he stated he did not know.  He describes his mood as "okay".  Did indicate he was tired and often is up late at night.  He reports he is continue to work his same job but admits he is not very committed to it and does not work his full hours regularly. He does  not show any outward signs of distress nor of being very anxious.  Denies any SI or HI. He has responded some better last session but today is more difficult to engage. Per last session, he did not follow through on his comments about getting back into the gym and working out some as he had done previously.  His family remains in touch with him although patient does not typically  initiate that contact.  More inward today, and may be more related to his autistic traits than other circumstances.  Does not seem agitated or upset but rather flat and unmotivated, which has been how he has presented on some other occasions.  We met for a shorter period of time due to the difficulty engaging patient, but he also rescheduled another appointment as he left within a couple weeks.  I did encourage him to consider following up on his previous plan to return to the gym as that seemed to benefit him physically and emotionally, as well as getting him around more people even if he is not very interactive with them.   Goal review and progress/challenges noted with patient.  Next appt within 2-3 weeks.   Shanon Ace, LCSW

## 2019-11-12 ENCOUNTER — Ambulatory Visit: Payer: BC Managed Care – PPO | Admitting: Psychiatry

## 2020-02-29 ENCOUNTER — Ambulatory Visit: Payer: BC Managed Care – PPO | Admitting: Psychiatry
